# Patient Record
Sex: Male | Born: 1985 | Race: White | Hispanic: No | Marital: Married | Smoking: Never smoker
Health system: Southern US, Community
[De-identification: ages and names within clinical notes are randomized; demographics above are authoritative.]

## PROBLEM LIST (undated history)

## (undated) HISTORY — PX: WISDOM TOOTH EXTRACTION: SHX21

## (undated) HISTORY — PX: FOOT SURGERY: SHX648

---

## 2008-02-14 ENCOUNTER — Emergency Department (HOSPITAL_COMMUNITY): Admission: EM | Admit: 2008-02-14 | Discharge: 2008-02-14 | Payer: Self-pay | Admitting: Family Medicine

## 2009-01-04 ENCOUNTER — Ambulatory Visit: Payer: Self-pay | Admitting: Family Medicine

## 2009-01-04 DIAGNOSIS — R6883 Chills (without fever): Secondary | ICD-10-CM

## 2009-01-04 DIAGNOSIS — S8000XA Contusion of unspecified knee, initial encounter: Secondary | ICD-10-CM | POA: Insufficient documentation

## 2009-01-04 DIAGNOSIS — S82009A Unspecified fracture of unspecified patella, initial encounter for closed fracture: Secondary | ICD-10-CM

## 2009-01-04 LAB — CONVERTED CEMR LAB: Influenza B Ag: NEGATIVE

## 2010-10-01 ENCOUNTER — Ambulatory Visit: Payer: BC Managed Care – PPO | Attending: Podiatry | Admitting: Physical Therapy

## 2010-10-01 DIAGNOSIS — M6281 Muscle weakness (generalized): Secondary | ICD-10-CM | POA: Insufficient documentation

## 2010-10-01 DIAGNOSIS — M25676 Stiffness of unspecified foot, not elsewhere classified: Secondary | ICD-10-CM | POA: Insufficient documentation

## 2010-10-01 DIAGNOSIS — IMO0001 Reserved for inherently not codable concepts without codable children: Secondary | ICD-10-CM | POA: Insufficient documentation

## 2010-10-01 DIAGNOSIS — M25673 Stiffness of unspecified ankle, not elsewhere classified: Secondary | ICD-10-CM | POA: Insufficient documentation

## 2010-10-15 ENCOUNTER — Ambulatory Visit: Payer: BC Managed Care – PPO | Admitting: Physical Therapy

## 2011-04-14 ENCOUNTER — Emergency Department (INDEPENDENT_AMBULATORY_CARE_PROVIDER_SITE_OTHER)
Admission: EM | Admit: 2011-04-14 | Discharge: 2011-04-14 | Disposition: A | Discharge status: Home / Self Care | Payer: BC Managed Care – PPO | Source: Home / Self Care | Attending: Family Medicine | Admitting: Family Medicine

## 2011-04-14 DIAGNOSIS — J020 Streptococcal pharyngitis: Secondary | ICD-10-CM

## 2011-04-14 LAB — POCT RAPID STREP A (OFFICE): Rapid Strep A Screen: POSITIVE — AB

## 2011-04-14 MED ORDER — AMOXICILLIN 875 MG PO TABS: 875.0000 mg | ORAL_TABLET | Freq: Two times a day (BID) | ORAL | Status: AC

## 2011-04-14 NOTE — ED Provider Notes (Signed)
History     CSN: [id number]  Arrival date & time 04/14/11  1342   First MD Initiated Contact with Patient 04/14/11 1511      No chief complaint on file.     HPI Comments: Patient complains of two day history of a sore throat, followed by a mild cough but no nasal congestion.    Complains of fatigue but no myalgias.  His cough is non-productive.  There has been no pleuritic pain, shortness of breath, or wheezes.  He has had fever and sweats.  The history is provided by the patient.    History reviewed. No pertinent past medical history.  Past Surgical History  Procedure Date  . Foot surgery     cyst removed on right foot  . Wisdom tooth extraction     History reviewed. No pertinent family history.  History  Substance Use Topics  . Smoking status: Never Smoker   . Smokeless tobacco: Not on file  . Alcohol Use: Yes      Review of Systems + sore throat + mild cough No pleuritic pain No wheezing No nasal congestion ? post-nasal drainage No sinus pain/pressure No itchy/red eyes No earache No hemoptysis No SOB + fever, + chills No nausea No vomiting No abdominal pain No diarrhea No urinary symptoms No skin rashes + fatigue No myalgias + headache   Allergies  Review of patient's allergies indicates no known allergies.  Home Medications   Current Outpatient Rx  Name Route Sig Dispense Refill  . AMOXICILLIN 875 MG PO TABS Oral Take 1 tablet (875 mg total) by mouth 2 (two) times daily. 20 tablet 0    BP 111/78  Pulse 67  Temp(Src) 98.5 F (36.9 C) (Oral)  Resp 18  Ht 5\' 11"  (1.803 m)  Wt 152 lb (68.947 kg)  BMI 21.20 kg/m2  SpO2 100%  Physical Exam Nursing notes and Vital Signs reviewed. Appearance:  Patient appears healthy, stated age, and in no acute distress Eyes:  Pupils are equal, round, and reactive to light and accomodation.  Extraocular movement is intact.  Conjunctivae are not inflamed  Ears:  Canals normal.  Tympanic membranes normal.    Nose:  Mildly congested turbinates.  No sinus tenderness.   Pharynx:  Erythematous without exudate. Neck:  Supple.  Tender shotty anterior nodes are palpated bilaterally  Lungs:  Clear to auscultation.  Breath sounds are equal.  Heart:  Regular rate and rhythm without murmurs, rubs, or gallops.  Abdomen:  Nontender without masses or hepatosplenomegaly.  Bowel sounds are present.  No CVA or flank tenderness.  Extremities:  No edema.  No calf tenderness Skin:  No rash present.   ED Course  Procedures  none   Labs Reviewed  POCT RAPID STREP A (OFFICE) positive      1. Streptococcal sore throat       MDM  Begin amoxicillin. May take Ibuprofen 200mg , 4 tabs every 8 hours with food.  Try warm salt water gargles. Followup with Family Doctor if not improved in one week.         Lattie Haw, MD 04/15/11 1230

## 2011-04-14 NOTE — Discharge Instructions (Signed)
May take Ibuprofen 200mg , 4 tabs every 8 hours with food.  Try warm salt water gargles.  Strep Throat Strep throat is an infection of the throat caused by a bacteria named Streptococcus pyogenes. Your caregiver may call the infection streptococcal "tonsillitis" or "pharyngitis" depending on whether there are signs of inflammation in the tonsils or back of the throat. Strep throat is most common in children from 25 to 28 years old during the cold months of the year, but it can occur in people of any age during any season. This infection is spread from person to person (contagious) through coughing, sneezing, or other close contact. SYMPTOMS   Fever or chills.   Painful, swollen, red tonsils or throat.   Pain or difficulty when swallowing.   White or yellow spots on the tonsils or throat.   Swollen, tender lymph nodes or "glands" of the neck or under the jaw.   Red rash all over the body (rare).  DIAGNOSIS  Many different infections can cause the same symptoms. A test must be done to confirm the diagnosis so the right treatment can be given. A "rapid strep test" can help your caregiver make the diagnosis in a few minutes. If this test is not available, a light swab of the infected area can be used for a throat culture test. If a throat culture test is done, results are usually available in a day or two. TREATMENT  Strep throat is treated with antibiotic medicine. HOME CARE INSTRUCTIONS   Gargle with 1 tsp of salt in 1 cup of warm water, 3 to 4 times per day or as needed for comfort.   Family members who also have a sore throat or fever should be tested for strep throat and treated with antibiotics if they have the strep infection.   Make sure everyone in your household washes their hands well.   Do not share food, drinking cups, or personal items that could cause the infection to spread to others.   You may need to eat a soft food diet until your sore throat gets better.   Drink enough  water and fluids to keep your urine clear or pale yellow. This will help prevent dehydration.   Get plenty of rest.   Stay home from school, daycare, or work until you have been on antibiotics for 24 hours.   Only take over-the-counter or prescription medicines for pain, discomfort, or fever as directed by your caregiver.   If antibiotics are prescribed, take them as directed. Finish them even if you start to feel better.  SEEK MEDICAL CARE IF:   The glands in your neck continue to enlarge.   You develop a rash, cough, or earache.   You cough up green, yellow-brown, or bloody sputum.   You have pain or discomfort not controlled by medicines.   Your problems seem to be getting worse rather than better.  SEEK IMMEDIATE MEDICAL CARE IF:   You develop any new symptoms such as vomiting, severe headache, stiff or painful neck, chest pain, shortness of breath, or trouble swallowing.   You develop severe throat pain, drooling, or changes in your voice.   You develop swelling of the neck, or the skin on the neck becomes red and tender.   You have a fever.   You develop signs of dehydration, such as fatigue, dry mouth, and decreased urination.   You become increasingly sleepy, or you cannot wake up completely.  Document Released: 12/22/1999 Document Revised: 12/13/2010 Document Reviewed:  02/22/2010 Exit[employer] Patient Information 2012 [place], [place].

## 2011-04-14 NOTE — ED Notes (Signed)
Fever, sore throat and HA started friday

## 2012-03-18 ENCOUNTER — Encounter: Payer: Self-pay | Admitting: Emergency Medicine

## 2012-03-18 ENCOUNTER — Emergency Department
Admission: EM | Admit: 2012-03-18 | Discharge: 2012-03-18 | Disposition: A | Discharge status: Home / Self Care | Payer: BC Managed Care – PPO | Source: Home / Self Care | Attending: Family Medicine | Admitting: Family Medicine

## 2012-03-18 DIAGNOSIS — H6690 Otitis media, unspecified, unspecified ear: Secondary | ICD-10-CM

## 2012-03-18 DIAGNOSIS — M26629 Arthralgia of temporomandibular joint, unspecified side: Secondary | ICD-10-CM

## 2012-03-18 DIAGNOSIS — H6692 Otitis media, unspecified, left ear: Secondary | ICD-10-CM

## 2012-03-18 DIAGNOSIS — H9209 Otalgia, unspecified ear: Secondary | ICD-10-CM

## 2012-03-18 LAB — POCT URINALYSIS DIP (MANUAL ENTRY)

## 2012-03-18 MED ORDER — AMOXICILLIN 875 MG PO TABS: 875.0000 mg | ORAL_TABLET | Freq: Two times a day (BID) | ORAL | Status: DC

## 2012-03-18 NOTE — ED Provider Notes (Signed)
History     CSN: [id number]  Arrival date & time 03/18/12  1718   First MD Initiated Contact with Patient 03/18/12 1826      Chief Complaint  Patient presents with  . Otalgia       HPI Comments: Patient awoke with left earache and headache two days ago.  No sinus congestion.  No fevers, chills, and sweats.  He feels well otherwise. He admits that he grinds his teeth at night.                                The history is provided by the patient.    History reviewed. No pertinent past medical history.  Past Surgical History  Procedure Laterality Date  . Foot surgery      cyst removed on right foot  . Wisdom tooth extraction      History reviewed. No pertinent family history.  History  Substance Use Topics  . Smoking status: Never Smoker   . Smokeless tobacco: Not on file  . Alcohol Use: Yes      Review of Systems No sore throat No cough No pleuritic pain No wheezing No nasal congestion No post-nasal drainage No sinus pain/pressure No itchy/red eyes + left earache No hemoptysis No SOB No fever/chills No nausea No vomiting No abdominal pain No diarrhea No urinary symptoms No skin rashes No fatigue No myalgias + headache    Allergies  Review of patient's allergies indicates no known allergies.  Home Medications   Current Outpatient Rx  Name  Route  Sig  Dispense  Refill  . amoxicillin (AMOXIL) 875 MG tablet   Oral   Take 1 tablet (875 mg total) by mouth 2 (two) times daily.   20 tablet   0     BP 99/65  Temp(Src) 98.1 F (36.7 C) (Oral)  Ht 5\' 11"  (1.803 m)  Wt 150 lb (68.04 kg)  BMI 20.93 kg/m2  SpO2 97%  Physical Exam Nursing notes and Vital Signs reviewed. Appearance:  Patient appears healthy, stated age, and in no acute distress Eyes:  Pupils are equal, round, and reactive to light and accomodation.  Extraocular movement is intact.  Conjunctivae are not inflamed  Ears:  Canals normal.  Tympanic membranes normal.  There is  distinct tenderness over the left temporomandibular joint.  Palpation there recreates his pain.   Nose:  Mildly congested turbinates.  No sinus tenderness.  Pharynx:  Normal Neck:  Supple.  No adenopathy Lungs:  Clear to auscultation.  Breath sounds are equal.  Heart:  Regular rate and rhythm without murmurs, rubs, or gallops.  Abdomen:  Nontender  Extremities:  Normal Skin:  No rash present.   ED Course  Procedures  none   Tympanogram:  Left ear low peak height ("flat line"); Right ear positive peak pressure   1. Left otitis media   2. TMJ arthralgia       MDM  Begin amoxicillin. Take Mucinex D (guaifenesin with decongestant) twice daily for congestion.  Increase fluid intake, rest. May use Afrin nasal spray (or generic oxymetazoline) twice daily for about 5 days.  Also recommend using saline nasal spray several times daily and saline nasal irrigation (AYR is a common brand) Stop all antihistamines for now, and other non-prescription cough/cold preparations. May take Ibuprofen 200mg , 4 tabs every 8 hours with food for left TMJ pain.  Apply ice pack to left TMJ several times daily for  about 15 minutes Given a [employer] patient information and instruction sheet on topic TMJ pain. Followup with ENT if not improving about 10 days.         Lattie Haw, MD 03/23/12 1426

## 2012-03-18 NOTE — ED Notes (Signed)
Reports 3 days history of left ear pain. No OTCs today.

## 2013-07-04 ENCOUNTER — Emergency Department
Admission: EM | Admit: 2013-07-04 | Discharge: 2013-07-04 | Disposition: A | Discharge status: Home / Self Care | Payer: BC Managed Care – PPO | Source: Home / Self Care | Attending: Family Medicine | Admitting: Family Medicine

## 2013-07-04 ENCOUNTER — Encounter: Payer: Self-pay | Admitting: Emergency Medicine

## 2013-07-04 DIAGNOSIS — S51809A Unspecified open wound of unspecified forearm, initial encounter: Secondary | ICD-10-CM | POA: Diagnosis not present

## 2013-07-04 DIAGNOSIS — W540XXA Bitten by dog, initial encounter: Secondary | ICD-10-CM | POA: Diagnosis not present

## 2013-07-04 DIAGNOSIS — S41109A Unspecified open wound of unspecified upper arm, initial encounter: Secondary | ICD-10-CM

## 2013-07-04 DIAGNOSIS — S41151A Open bite of right upper arm, initial encounter: Secondary | ICD-10-CM

## 2013-07-04 DIAGNOSIS — Z23 Encounter for immunization: Secondary | ICD-10-CM

## 2013-07-04 DIAGNOSIS — S51811A Laceration without foreign body of right forearm, initial encounter: Secondary | ICD-10-CM

## 2013-07-04 MED ORDER — AMOXICILLIN-POT CLAVULANATE 875-125 MG PO TABS: 1.0000 | ORAL_TABLET | Freq: Two times a day (BID) | ORAL | Status: DC

## 2013-07-04 MED ORDER — TETANUS-DIPHTH-ACELL PERTUSSIS 5-2.5-18.5 LF-MCG/0.5 IM SUSP
0.5000 mL | Freq: Once | INTRAMUSCULAR | Status: AC
  Administered 2013-07-04: 0.5 mL via INTRAMUSCULAR

## 2013-07-04 NOTE — Discharge Instructions (Signed)
Change dressing daily and apply Bacitracin ointment to wound.  Keep wound clean and dry.  Return for any signs of infection (or follow-up with family doctor):  Increasing redness, swelling, pain, heat, drainage, etc. °Return in 10 days for suture removal.   ° ° °Laceration Care, Adult °A laceration is a cut or lesion that goes through all layers of the skin and into the tissue just beneath the skin. °TREATMENT  °Some lacerations may not require closure. Some lacerations may not be able to be closed due to an increased risk of infection. It is important to see your caregiver as soon as possible after an injury to minimize the risk of infection and maximize the opportunity for successful closure. °If closure is appropriate, pain medicines may be given, if needed. The wound will be cleaned to help prevent infection. Your caregiver will use stitches (sutures), staples, wound glue (adhesive), or skin adhesive strips to repair the laceration. These tools bring the skin edges together to allow for faster healing and a better cosmetic outcome. However, all wounds will heal with a scar. Once the wound has healed, scarring can be minimized by covering the wound with sunscreen during the day for 1 full year. °HOME CARE INSTRUCTIONS  °For sutures or staples: °· Keep the wound clean and dry. °· If you were given a bandage (dressing), you should change it at least once a day. Also, change the dressing if it becomes wet or dirty, or as directed by your caregiver. °· Wash the wound with soap and water 2 times a day. Rinse the wound off with water to remove all soap. Pat the wound dry with a clean towel. °· After cleaning, apply a thin layer of the antibiotic ointment as recommended by your caregiver. This will help prevent infection and keep the dressing from sticking. °· You may shower as usual after the first 24 hours. Do not soak the wound in water until the sutures are removed. °· Only take over-the-counter or prescription  medicines for pain, discomfort, or fever as directed by your caregiver. °· Get your sutures or staples removed as directed by your caregiver. °For skin adhesive strips: °· Keep the wound clean and dry. °· Do not get the skin adhesive strips wet. You may bathe carefully, using caution to keep the wound dry. °· If the wound gets wet, pat it dry with a clean towel. °· Skin adhesive strips will fall off on their own. You may trim the strips as the wound heals. Do not remove skin adhesive strips that are still stuck to the wound. They will fall off in time. °For wound adhesive: °· You may briefly wet your wound in the shower or bath. Do not soak or scrub the wound. Do not swim. Avoid periods of heavy perspiration until the skin adhesive has fallen off on its own. After showering or bathing, gently pat the wound dry with a clean towel. °· Do not apply liquid medicine, cream medicine, or ointment medicine to your wound while the skin adhesive is in place. This may loosen the film before your wound is healed. °· If a dressing is placed over the wound, be careful not to apply tape directly over the skin adhesive. This may cause the adhesive to be pulled off before the wound is healed. °· Avoid prolonged exposure to sunlight or tanning lamps while the skin adhesive is in place. Exposure to ultraviolet light in the first year will darken the scar. °· The skin adhesive will usually   remain in place for 5 to 10 days, then naturally fall off the skin. Do not pick at the adhesive film. You may need a tetanus shot if:  You cannot remember when you had your last tetanus shot.  You have never had a tetanus shot. If you get a tetanus shot, your arm may swell, get red, and feel warm to the touch. This is common and not a problem. If you need a tetanus shot and you choose not to have one, there is a rare chance of getting tetanus. Sickness from tetanus can be serious. SEEK MEDICAL CARE IF:   You have redness, swelling, or  increasing pain in the wound.  You see a red line that goes away from the wound.  You have yellowish-white fluid (pus) coming from the wound.  You have a fever.  You notice a bad smell coming from the wound or dressing.  Your wound breaks open before or after sutures have been removed.  You notice something coming out of the wound such as wood or glass.  Your wound is on your hand or foot and you cannot move a finger or toe. SEEK IMMEDIATE MEDICAL CARE IF:   Your pain is not controlled with prescribed medicine.  You have severe swelling around the wound causing pain and numbness or a change in color in your arm, hand, leg, or foot.  Your wound splits open and starts bleeding.  You have worsening numbness, weakness, or loss of function of any joint around or beyond the wound.  You develop painful lumps near the wound or on the skin anywhere on your body. MAKE SURE YOU:   Understand these instructions.  Will watch your condition.  Will get help right away if you are not doing well or get worse. Document Released: 12/24/2004 Document Revised: 03/18/2011 Document Reviewed: 06/19/2010 [facility] Patient Information 2015 [place], [place]. This information is not intended to replace advice given to you by your health care provider. Make sure you discuss any questions you have with your health care provider.    Animal Bite An animal bite can result in a scratch on the skin, deep open cut, puncture of the skin, crush injury, or tearing away of the skin or a body part. Dogs are responsible for most animal bites. Children are bitten more often than adults. An animal bite can range from very mild to more serious. A small bite from your house pet is no cause for alarm. However, some animal bites can become infected or injure a bone or other tissue. You must seek medical care if:  The skin is broken and bleeding does not slow down or stop after 15 minutes.  The puncture is deep and  difficult to clean (such as a cat bite).  Pain, warmth, redness, or pus develops around the wound.  The bite is from a stray animal or rodent. There may be a risk of rabies infection.  The bite is from a snake, raccoon, skunk, fox, coyote, or bat. There may be a risk of rabies infection.  The person bitten has a chronic illness such as diabetes, liver disease, or cancer, or the person takes medicine that lowers the immune system.  There is concern about the location and severity of the bite. It is important to clean and protect an animal bite wound right away to prevent infection. Follow these steps:  Clean the wound with plenty of water and soap.  Apply an antibiotic cream.  Apply gentle pressure over the  wound with a clean towel or gauze to slow or stop bleeding.  Elevate the affected area above the heart to help stop any bleeding.  Seek medical care. Getting medical care within 8 hours of the animal bite leads to the best possible outcome. DIAGNOSIS  Your caregiver will most likely:  Take a detailed history of the animal and the bite injury.  Perform a wound exam.  Take your medical history. Blood tests or X-rays may be performed. Sometimes, infected bite wounds are cultured and sent to a lab to identify the infectious bacteria.  TREATMENT  Medical treatment will depend on the location and type of animal bite as well as the patient's medical history. Treatment may include:  Wound care, such as cleaning and flushing the wound with saline solution, bandaging, and elevating the affected area.  Antibiotics.  Tetanus immunization.  Rabies immunization.  Leaving the wound open to heal. This is often done with animal bites, due to the high risk of infection. However, in certain cases, wound closure with stitches, wound adhesive, skin adhesive strips, or staples may be used. Infected bites that are left untreated may require intravenous (IV) antibiotics and surgical treatment  in the hospital. HOME CARE INSTRUCTIONS  Follow your caregiver's instructions for wound care.  Take all medicines as directed.  If your caregiver prescribes antibiotics, take them as directed. Finish them even if you start to feel better.  Follow up with your caregiver for further exams or immunizations as directed. You may need a tetanus shot if:  You cannot remember when you had your last tetanus shot.  You have never had a tetanus shot.  The injury broke your skin. If you get a tetanus shot, your arm may swell, get red, and feel warm to the touch. This is common and not a problem. If you need a tetanus shot and you choose not to have one, there is a rare chance of getting tetanus. Sickness from tetanus can be serious. SEEK MEDICAL CARE IF:  You notice warmth, redness, soreness, swelling, pus discharge, or a bad smell coming from the wound.  You have a red line on the skin coming from the wound.  You have a fever, chills, or a general ill feeling.  You have nausea or vomiting.  You have continued or worsening pain.  You have trouble moving the injured part.  You have other questions or concerns. MAKE SURE YOU:  Understand these instructions.  Will watch your condition.  Will get help right away if you are not doing well or get worse. Document Released: 09/11/2010 Document Revised: 03/18/2011 Document Reviewed: 09/11/2010 [facility] Patient Information 2015 [place], [place]. This information is not intended to replace advice given to you by your health care provider. Make sure you discuss any questions you have with your health care provider.

## 2013-07-04 NOTE — ED Provider Notes (Signed)
CSN: [id number]     Arrival date & time 07/04/13  1108 History   First MD Initiated Contact with Patient 07/04/13 1120     Chief Complaint  Patient presents with  . Animal Bite      HPI Comments: Patient's dog began fight with a visiting friend's dog today.  Patient tried to separate the two dogs and his own dog bit him on his right arm.  His dog's rabies vaccination is current.  Patient believes that his last Tdap was in 2005.  Patient is a 28 y.o. male presenting with animal bite. The history is provided by the patient.  Animal Bite Contact animal:  Dog Location:  Shoulder/arm Shoulder/arm injury location:  R forearm Time since incident:  2 hours Incident location:  Home Animal's rabies vaccination status:  Up to date Animal in possession: yes   Associated symptoms: no numbness and no swelling     History reviewed. No pertinent past medical history. Past Surgical History  Procedure Laterality Date  . Foot surgery      cyst removed on right foot  . Wisdom tooth extraction     History reviewed. No pertinent family history. History  Substance Use Topics  . Smoking status: Never Smoker   . Smokeless tobacco: Not on file  . Alcohol Use: Yes     Comment: 1    Review of Systems  Neurological: Negative for numbness.    Allergies  Review of patient's allergies indicates no known allergies.  Home Medications   Prior to Admission medications   Medication Sig Start Date End Date Taking? Authorizing Provider  amoxicillin-clavulanate (AUGMENTIN) 875-125 MG per tablet Take 1 tablet by mouth 2 (two) times daily. Take with food 07/04/13   Lattie HawStephen A Beese, MD   BP 112/68  Pulse 61  Temp(Src) 98.2 F (36.8 C) (Oral)  Resp 20  Ht 5\' 11"  (1.803 m)  Wt 154 lb 4 oz (69.967 kg)  BMI 21.52 kg/m2  SpO2 100% Physical Exam  Nursing note and vitals reviewed. Constitutional: He is oriented to person, place, and time. He appears well-developed and well-nourished. No distress.  HENT:    Head: Atraumatic.  Eyes: Conjunctivae are normal. Pupils are equal, round, and reactive to light.  Neurological: He is alert and oriented to person, place, and time.  Skin: Skin is warm and dry. Laceration noted.     Laceration #2:  6mm by 2mm    ED Course  Procedures Laceration Repair Discussed benefits and risks of procedure and verbal consent obtained. Using sterile technique and digital 1% lidocaine with epinephrine, cleansed wounds with Betadine followed by copious lavage with normal saline.  Wounds carefully inspected for debris and foreign bodies; none found.  Each wound loosely and partially closed with a single 4-0 nylon suture.  Bacitracin and non-stick sterile dressing applied.  Wound precautions explained to patient.  Return for suture removal in 10 days.        MDM   1. Dog bite of arm, right, initial encounter   2. Laceration of right forearm, initial encounter.  Each wound partially closed with a single suture to promote more rapid epithialization.    Tdap administered Begin Augmentin. Change dressing daily and apply Bacitracin ointment to wound.  Keep wound clean and dry.  Return for any signs of infection (or follow-up with family doctor):  Increasing redness, swelling, pain, heat, drainage, etc. Return in 10 days for suture removal.       Lattie HawStephen A Beese, MD 07/04/13  1810 

## 2013-07-04 NOTE — ED Notes (Addendum)
Sustained dog bite from own dog while trying to separated from fight. Sustained 2 lacerations, one on dorsal surface and one on prone surface plus 3 minor scratches from other teeth. [facility]

## 2013-07-06 ENCOUNTER — Encounter: Payer: Self-pay | Admitting: Emergency Medicine

## 2013-07-06 ENCOUNTER — Emergency Department
Admission: EM | Admit: 2013-07-06 | Discharge: 2013-07-06 | Disposition: A | Discharge status: Home / Self Care | Payer: BC Managed Care – PPO | Source: Home / Self Care | Attending: Emergency Medicine | Admitting: Emergency Medicine

## 2013-07-06 DIAGNOSIS — S51809A Unspecified open wound of unspecified forearm, initial encounter: Secondary | ICD-10-CM

## 2013-07-06 DIAGNOSIS — S51851S Open bite of right forearm, sequela: Secondary | ICD-10-CM

## 2013-07-06 DIAGNOSIS — L089 Local infection of the skin and subcutaneous tissue, unspecified: Secondary | ICD-10-CM

## 2013-07-06 DIAGNOSIS — W540XXS Bitten by dog, sequela: Secondary | ICD-10-CM

## 2013-07-06 DIAGNOSIS — W540XXD Bitten by dog, subsequent encounter: Principal | ICD-10-CM

## 2013-07-06 DIAGNOSIS — S51851D Open bite of right forearm, subsequent encounter: Secondary | ICD-10-CM

## 2013-07-06 MED ORDER — DOXYCYCLINE HYCLATE 100 MG PO CAPS: 100.0000 mg | ORAL_CAPSULE | Freq: Two times a day (BID) | ORAL | Status: DC

## 2013-07-06 MED ORDER — CEFTRIAXONE SODIUM 1 G IJ SOLR
1.0000 g | INTRAMUSCULAR | Status: AC
  Administered 2013-07-06: 1 g via INTRAMUSCULAR

## 2013-07-06 MED ORDER — CLINDAMYCIN HCL 150 MG PO CAPS: 150.0000 mg | ORAL_CAPSULE | Freq: Four times a day (QID) | ORAL | Status: DC

## 2013-07-06 NOTE — ED Notes (Signed)
The pt is here today for a recheck of his dog bite to his LT forearm x 2 days ago. He reports increased redness and swelling around the bite.

## 2013-07-06 NOTE — ED Provider Notes (Signed)
CSN: [id number]     Arrival date & time 07/06/13  1537 History   First MD Initiated Contact with Patient 07/06/13 1550     Chief Complaint  Patient presents with  . Animal Bite   (Consider location/radiation/quality/duration/timing/severity/associated sxs/prior Treatment) HPI Was seen here 2 days ago for a dog bite right forearm. I've reviewed the notes here from Dr. Cathren HarshBeese from 07/04/2013.  Patient reports mildly increased redness and swelling around the bite right forearm. No bleeding or drainage or red streaks. Mild discomfort, pain 2/10. No fever or chills or systemic symptoms. He otherwise feels well. History reviewed. No pertinent past medical history. Past Surgical History  Procedure Laterality Date  . Foot surgery      cyst removed on right foot  . Wisdom tooth extraction     History reviewed. No pertinent family history. History  Substance Use Topics  . Smoking status: Never Smoker   . Smokeless tobacco: Not on file  . Alcohol Use: Yes     Comment: 1    Review of Systems  All other systems reviewed and are negative.   Allergies  Review of patient's allergies indicates no known allergies.  Home Medications   Prior to Admission medications   Medication Sig Start Date End Date Taking? Authorizing Provider  clindamycin (CLEOCIN) 150 MG capsule Take 1 capsule (150 mg total) by mouth every 6 (six) hours. X10 days 07/06/13   Lajean Manesavid Massey, MD  doxycycline (VIBRAMYCIN) 100 MG capsule Take 1 capsule (100 mg total) by mouth 2 (two) times daily. 07/06/13   Lajean Manesavid Massey, MD   BP 126/78  Pulse 53  Temp(Src) 98.3 F (36.8 C) (Oral)  Resp 16  SpO2 100% Physical Exam  Musculoskeletal:       Arms: As depicted, 2 x 4 cm area of redness, warmth, minimal induration, right forearm. No drainage or bleeding or fluctuance or red streaks.  Neurovascular distally intact. No bony tenderness. No joint swelling. Function right upper extremity normal.   No distress . ED Course    Procedures (including critical care time) Labs Review Labs Reviewed - No data to display  Imaging Review No results found.   MDM   1. Dog bite of right forearm, subsequent encounter   2. Dog bite of right forearm with infection, sequela    Mild cellulitis. No evidence of abscess or ascending lymphangitis. Treatment options discussed, as well as risks, benefits, alternatives. Patient voiced understanding and agreement with the following plans: Rocephin 1 g IM stat  DC Augmentin. Wound cleansed and redressed. Begin doxycycline and clindamycin. I've elected not to remove the one stitch from each puncture wound, as the wounds are partially open, and there is room for any drainage if needed. However there is not currently any drainage or bleeding or fluctuance.  Red flags discussed. Return in 2 days for recheck, sooner if worse. If any worsening, will remove for each stitch, but he declined that option today.  Lajean Manesavid Massey, MD 07/06/13 [phone]

## 2013-07-08 ENCOUNTER — Emergency Department (INDEPENDENT_AMBULATORY_CARE_PROVIDER_SITE_OTHER)
Admission: EM | Admit: 2013-07-08 | Discharge: 2013-07-08 | Disposition: A | Discharge status: Home / Self Care | Payer: BC Managed Care – PPO | Source: Home / Self Care | Attending: Emergency Medicine | Admitting: Emergency Medicine

## 2013-07-08 ENCOUNTER — Encounter: Payer: Self-pay | Admitting: Emergency Medicine

## 2013-07-08 DIAGNOSIS — S51809A Unspecified open wound of unspecified forearm, initial encounter: Secondary | ICD-10-CM

## 2013-07-08 DIAGNOSIS — S51851D Open bite of right forearm, subsequent encounter: Secondary | ICD-10-CM

## 2013-07-08 DIAGNOSIS — W540XXD Bitten by dog, subsequent encounter: Principal | ICD-10-CM

## 2013-07-08 NOTE — ED Provider Notes (Addendum)
CSN: [id number]     Arrival date & time 07/08/13  1340 History   First MD Initiated Contact with Patient 07/08/13 1344     Chief Complaint  Patient presents with  . Animal Bite   (Consider location/radiation/quality/duration/timing/severity/associated sxs/prior Treatment) HPI Eric Esparza is here to follow up dog bite. Denies fever, chills or sweats. He has had a decrease in redness and swelling around the bite right forearm after being switched to doxycycline and clindamycin at recheck 2 days ago.   History reviewed. No pertinent past medical history. Past Surgical History  Procedure Laterality Date  . Foot surgery      cyst removed on right foot  . Wisdom tooth extraction     History reviewed. No pertinent family history. History  Substance Use Topics  . Smoking status: Never Smoker   . Smokeless tobacco: Not on file  . Alcohol Use: Yes     Comment: 1    Review of Systems  All other systems reviewed and are negative.   Allergies  Review of patient's allergies indicates no known allergies.  Home Medications   Prior to Admission medications   Medication Sig Start Date End Date Taking? Authorizing Provider  clindamycin (CLEOCIN) 150 MG capsule Take 1 capsule (150 mg total) by mouth every 6 (six) hours. X10 days 07/06/13  Yes Lajean Manesavid Massey, MD  doxycycline (VIBRAMYCIN) 100 MG capsule Take 1 capsule (100 mg total) by mouth 2 (two) times daily. 07/06/13  Yes Lajean Manesavid Massey, MD   BP 112/72  Pulse 53  Temp(Src) 98.2 F (36.8 C) (Oral)  Ht 5\' 11"  (1.803 m)  Wt 155 lb (70.308 kg)  BMI 21.63 kg/m2  SpO2 98% Physical Exam  Nursing note and vitals reviewed. Constitutional: He is oriented to person, place, and time. He appears well-developed and well-nourished. No distress.  HENT:  Head: Normocephalic and atraumatic.  Eyes: Conjunctivae and EOM are normal. Pupils are equal, round, and reactive to light. No scleral icterus.  Neck: Normal range of motion.  Cardiovascular: Normal rate.    Pulmonary/Chest: Effort normal.  Abdominal: He exhibits no distension.  Musculoskeletal: Normal range of motion.  Neurological: He is alert and oriented to person, place, and time.  Skin: Skin is warm.  Psychiatric: He has a normal mood and affect.   Right forearm: The swelling, redness, warmth has resolved from 2 days ago. The 2 small wounds are clean and dry with 1 suture intact in each. Healing and granulating in as expected , 4 days status post wound/dog bite. Minimal tenderness . Neurovascular distally intact ED Course  Procedures (including critical care time) Labs Review Labs Reviewed - No data to display  Imaging Review No results found.   MDM   1. Dog bite of forearm, right, subsequent encounter    4 days status post dog bite  Significantly improving. Cellulitis is much improving. Today, we cleansed the 2 wounds with Hibiclens and dressed with Polysporin and nonstick dressing . Continue doxycycline and clindamycin. Continued daily wound care at home Red flags discussed. Return in 6 days for recheck and would anticipate suture removal if he is doing well.   Lajean Manesavid Massey, MD 07/08/13 1546  Lajean Manesavid Massey, MD 07/08/13 [phone]

## 2013-07-08 NOTE — ED Notes (Signed)
Eric RuizJohn is here to follow up dog bite. Denies fever, chills or sweats. He has had a decrease in redness and swelling around the bite after being on antibiotic.

## 2013-07-14 ENCOUNTER — Encounter: Payer: Self-pay | Admitting: Emergency Medicine

## 2013-07-14 ENCOUNTER — Emergency Department (INDEPENDENT_AMBULATORY_CARE_PROVIDER_SITE_OTHER)
Admission: EM | Admit: 2013-07-14 | Discharge: 2013-07-14 | Disposition: A | Discharge status: Home / Self Care | Payer: BC Managed Care – PPO | Source: Home / Self Care | Attending: Emergency Medicine | Admitting: Emergency Medicine

## 2013-07-14 DIAGNOSIS — Z4802 Encounter for removal of sutures: Secondary | ICD-10-CM

## 2013-07-14 NOTE — ED Provider Notes (Signed)
CSN: [id number]     Arrival date & time 07/14/13  1428 History   First MD Initiated Contact with Patient 07/14/13 1437     Chief Complaint  Patient presents with  . Suture / Staple Removal   (Consider location/radiation/quality/duration/timing/severity/associated sxs/prior Treatment) HPI Patient here for followup and suture removal.  He was 10 days ago that he had a dog bite and had 2 sutures placed.  And 2 days later he had a small infection and was then switched from amoxicillin to clindamycin..  No further signs of infection, no fever, chills, nausea, vomiting or any other constitutional symptoms.  No redness.  No past medical history on file. Past Surgical History  Procedure Laterality Date  . Foot surgery      cyst removed on right foot  . Wisdom tooth extraction     No family history on file. History  Substance Use Topics  . Smoking status: Never Smoker   . Smokeless tobacco: Not on file  . Alcohol Use: Yes     Comment: 1    Review of Systems  All other systems reviewed and are negative.   Allergies  Review of patient's allergies indicates no known allergies.  Home Medications   Prior to Admission medications   Medication Sig Start Date End Date Taking? Authorizing Provider  clindamycin (CLEOCIN) 150 MG capsule Take 1 capsule (150 mg total) by mouth every 6 (six) hours. X10 days 07/06/13   Lajean Manesavid Massey, MD  doxycycline (VIBRAMYCIN) 100 MG capsule Take 1 capsule (100 mg total) by mouth 2 (two) times daily. 07/06/13   Lajean Manesavid Massey, MD   There were no vitals taken for this visit. Physical Exam  Nursing note and vitals reviewed. Constitutional: He is oriented to person, place, and time. He appears well-developed and well-nourished.  HENT:  Head: Normocephalic and atraumatic.  Eyes: No scleral icterus.  Neck: Neck supple.  Cardiovascular: Regular rhythm and normal heart sounds.   Pulmonary/Chest: Effort normal and breath sounds normal. No respiratory distress.   Neurological: He is alert and oriented to person, place, and time.  Skin: Skin is warm and dry.  See previous note for locations.  Right arm wounds show no signs of infection, no tenderness, no redness, no wound dehiscence, no drainage, no bleeding, no induration or fluctuance.  Psychiatric: He has a normal mood and affect. His speech is normal.    ED Course  Procedures (including critical care time) Labs Review Labs Reviewed - No data to display  Imaging Review No results found.   MDM  No diagnosis found.  Sutures are removed.  No wound dehiscence.  No signs of infection.  Patient is going to stop the antibiotics as of today.  Covered with a small Steri-Strip, wound precautions are given.  Marlaine HindJeffrey H Kamariah Fruchter, MD 07/14/13 [phone]

## 2013-07-14 NOTE — ED Notes (Signed)
Eric RuizJohn is here today for suture removal from his RT forearm.

## 2014-01-12 ENCOUNTER — Emergency Department
Admission: EM | Admit: 2014-01-12 | Discharge: 2014-01-12 | Disposition: A | Discharge status: Home / Self Care | Payer: BLUE CROSS/BLUE SHIELD | Source: Home / Self Care | Attending: Family Medicine | Admitting: Family Medicine

## 2014-01-12 ENCOUNTER — Encounter: Payer: Self-pay | Admitting: *Deleted

## 2014-01-12 DIAGNOSIS — M545 Low back pain, unspecified: Secondary | ICD-10-CM

## 2014-01-12 MED ORDER — MELOXICAM 15 MG PO TABS: 15.0000 mg | ORAL_TABLET | Freq: Every day | ORAL | Status: DC

## 2014-01-12 MED ORDER — CYCLOBENZAPRINE HCL 10 MG PO TABS: ORAL_TABLET | ORAL | Status: DC

## 2014-01-12 NOTE — Discharge Instructions (Signed)
Apply ice pack for 20 to 30 minutes, 3 to 4 times daily  Continue until pain decreases.  ° ° °Back Pain, Adult °Low back pain is very common. About 1 in 5 people have back pain. The cause of low back pain is rarely dangerous. The pain often gets better over time. About half of people with a sudden onset of back pain feel better in just 2 weeks. About 8 in 10 people feel better by 6 weeks.  °CAUSES °Some common causes of back pain include: °· Strain of the muscles or ligaments supporting the spine. °· Wear and tear (degeneration) of the spinal discs. °· Arthritis. °· Direct injury to the back. °DIAGNOSIS °Most of the time, the direct cause of low back pain is not known. However, back pain can be treated effectively even when the exact cause of the pain is unknown. Answering your caregiver's questions about your overall health and symptoms is one of the most accurate ways to make sure the cause of your pain is not dangerous. If your caregiver needs more information, he or she may order lab work or imaging tests (X-rays or MRIs). However, even if imaging tests show changes in your back, this usually does not require surgery. °HOME CARE INSTRUCTIONS °For many people, back pain returns. Since low back pain is rarely dangerous, it is often a condition that people can learn to manage on their own.  °· Remain active. It is stressful on the back to sit or stand in one place. Do not sit, drive, or stand in one place for more than 30 minutes at a time. Take short walks on level surfaces as soon as pain allows. Try to increase the length of time you walk each day. °· Do not stay in bed. Resting more than 1 or 2 days can delay your recovery. °· Do not avoid exercise or work. Your body is made to move. It is not dangerous to be active, even though your back may hurt. Your back will likely heal faster if you return to being active before your pain is gone. °· Pay attention to your body when you  bend and lift. Many people have  less discomfort when lifting if they bend their knees, keep the load close to their bodies, and avoid twisting. Often, the most comfortable positions are those that put less stress on your recovering back. °· Find a comfortable position to sleep. Use a firm mattress and lie on your side with your knees slightly bent. If you lie on your back, put a pillow under your knees. °· Only take over-the-counter or prescription medicines as directed by your caregiver. Over-the-counter medicines to reduce pain and inflammation are often the most helpful. Your caregiver may prescribe muscle relaxant drugs. These medicines help dull your pain so you can more quickly return to your normal activities and healthy exercise. °· Put ice on the injured area. °¨ Put ice in a plastic bag. °¨ Place a towel between your skin and the bag. °¨ Leave the ice on for 15-20 minutes, 03-04 times a day for the first 2 to 3 days. After that, ice and heat may be alternated to reduce pain and spasms. °· Ask your caregiver about trying back exercises and gentle massage. This may be of some benefit. °· Avoid feeling anxious or stressed. Stress increases muscle tension and can worsen back pain. It is important to recognize when you are anxious or stressed and learn ways to manage it. Exercise is a great option. °SEEK MEDICAL CARE IF: °· You have pain that is not relieved with rest or medicine. °· You have   pain that does not improve in 1 week. °· You have new symptoms. °· You are generally not feeling well. °SEEK IMMEDIATE MEDICAL CARE IF:  °· You have pain that radiates from your back into your legs. °· You develop new bowel or bladder control problems. °· You have unusual weakness or numbness in your arms or legs. °· You develop nausea or vomiting. °· You develop abdominal pain. °· You feel faint. °Document Released: 12/24/2004 Document Revised: 06/25/2011 Document Reviewed: 04/27/2013 °Exit[employer]® Patient Information ©2015 [employer]. This information  is not intended to replace advice given to you by your health care provider. Make sure you discuss any questions you have with your health care provider. ° °

## 2014-01-12 NOTE — ED Notes (Signed)
Pt c/o low back pain x 5 days. Denies injury.  No OTC meds.

## 2014-01-12 NOTE — ED Provider Notes (Signed)
CSN: [id number]     Arrival date & time 01/12/14  1405 History   First MD Initiated Contact with Patient 01/12/14 1458     Chief Complaint  Patient presents with  . Back Pain      HPI Comments: Patient complains of five day history of constant sharp non-radiating lower back pain.  The pain is worse when walking and with movement, such as climbing into a car.  No bowel or bladder dysfunction, and no saddle numbness.  He recalls no injury.  However, he had resection of a ganglion cyst on his left foot 3 weeks ago and wore a cast boot for two weeks, resulting in an abnormal gait.  Patient is a 29 y.o. male presenting with back pain. The history is provided by the patient.  Back Pain Location:  Lumbar spine Quality:  Stabbing Radiates to:  Does not radiate Pain severity:  Moderate Pain is:  Same all the time Onset quality:  Sudden Duration:  5 days Timing:  Constant Progression:  Unchanged Chronicity:  New Context comment:  Recent foot surgery Relieved by:  Nothing Worsened by:  Movement and ambulation Ineffective treatments:  Ibuprofen Associated symptoms: no abdominal pain, no bladder incontinence, no bowel incontinence, no dysuria, no fever, no leg pain, no numbness, no paresthesias, no perianal numbness, no tingling, no weakness and no weight loss     History reviewed. No pertinent past medical history. Past Surgical History  Procedure Laterality Date  . Foot surgery      cyst removed on right foot  . Wisdom tooth extraction     History reviewed. No pertinent family history. History  Substance Use Topics  . Smoking status: Never Smoker   . Smokeless tobacco: Not on file  . Alcohol Use: Yes     Comment: 1    Review of Systems  Constitutional: Negative for fever and weight loss.  Gastrointestinal: Negative for abdominal pain and bowel incontinence.  Genitourinary: Negative for bladder incontinence and dysuria.  Musculoskeletal: Positive for back pain.  Neurological:  Negative for tingling, weakness, numbness and paresthesias.  All other systems reviewed and are negative.   Allergies  Review of patient's allergies indicates no known allergies.  Home Medications   Prior to Admission medications   Medication Sig Start Date End Date Taking? Authorizing Provider  cyclobenzaprine (FLEXERIL) 10 MG tablet Take one tab by mouth at bedtime 01/12/14   Lattie Haw, MD  meloxicam (MOBIC) 15 MG tablet Take 1 tablet (15 mg total) by mouth daily. Take with food each morning 01/12/14   Lattie Haw, MD   BP 115/74 mmHg  Pulse 50  Temp(Src) 98.1 F (36.7 C) (Oral)  Resp 14  Ht  (1.803 m)  Wt 156 lb (70.761 kg)  BMI 21.77 kg/m2  SpO2 98% Physical Exam  Constitutional: He is oriented to person, place, and time. He appears well-developed and well-nourished. No distress.  HENT:  Head: Normocephalic.  Eyes: Pupils are equal, round, and reactive to light.  Cardiovascular: Normal heart sounds.   Pulmonary/Chest: Breath sounds normal.  Abdominal: There is no tenderness.  Musculoskeletal:       Lumbar back: He exhibits normal range of motion, no tenderness, no bony tenderness, no swelling, no edema and no deformity.  Back:  Can heel/toe walk and squat without difficulty.  No tenderness to palpation.  Straight leg raising test is negative.  Sitting knee extension test is negative.  Strength and sensation in the lower extremities is normal.  Patellar and achilles reflexes are normal   Neurological: He is alert and oriented to person, place, and time.  Skin: Skin is warm and dry.  Nursing note and vitals reviewed.   ED Course  Procedures  none     MDM   1. Bilateral low back pain without sciatica    Begin Mobic and Flexeril. Apply ice pack for 20 to 30 minutes, 3 to 4 times daily  Continue until pain decreases. Begin back exercises. Followup with Dr. Rodney Langtonhomas [facility]kand[facility] ([facility] if not improving about two weeks.     Lattie HawStephen A  Kymir Coles, MD 01/12/14 [phone]

## 2015-07-03 ENCOUNTER — Emergency Department
Admission: EM | Admit: 2015-07-03 | Discharge: 2015-07-03 | Disposition: A | Discharge status: Home / Self Care | Payer: BLUE CROSS/BLUE SHIELD | Source: Home / Self Care | Attending: Family Medicine | Admitting: Family Medicine

## 2015-07-03 ENCOUNTER — Encounter: Payer: Self-pay | Admitting: *Deleted

## 2015-07-03 DIAGNOSIS — L259 Unspecified contact dermatitis, unspecified cause: Secondary | ICD-10-CM | POA: Diagnosis not present

## 2015-07-03 DIAGNOSIS — L298 Other pruritus: Secondary | ICD-10-CM

## 2015-07-03 MED ORDER — TRIAMCINOLONE ACETONIDE 0.1 % EX CREA: 1.0000 "application " | TOPICAL_CREAM | Freq: Two times a day (BID) | CUTANEOUS | Status: DC

## 2015-07-03 MED ORDER — HYDROXYZINE HCL 25 MG PO TABS: 25.0000 mg | ORAL_TABLET | Freq: Four times a day (QID) | ORAL | Status: DC | PRN

## 2015-07-03 MED ORDER — PREDNISONE 20 MG PO TABS: ORAL_TABLET | ORAL | Status: DC

## 2015-07-03 NOTE — Discharge Instructions (Signed)
You have been prescribed 5 days of prednisone, an oral steroid to help with inflammation and itching.  You may start this medication tomorrow with breakfast as it can make it difficult to sleep if taken at night.  Atarax (hydroxizine) is an antihistamine that can be taken to help with itching. This medication can cause drowsiness so do not drive or drink alcohol while taking.   You may also try over the counter non-drowsy antihistamine such as Claritin or Zyrtec (generic form is okay too) to take during the day as needed for itching.   Contact Dermatitis Dermatitis is redness, soreness, and swelling (inflammation) of the skin. Contact dermatitis is a reaction to certain substances that touch the skin. You either touched something that irritated your skin, or you have allergies to something you touched.  HOME CARE  Skin Care  Moisturize your skin as needed.  Apply cool compresses to the affected areas.   Try taking a bath with:   Epsom salts. Follow the instructions on the package. You can get these at a pharmacy or grocery store.   Baking soda. Pour a small amount into the bath as told by your doctor.   Colloidal oatmeal. Follow the instructions on the package. You can get this at a pharmacy or grocery store.   Try applying baking soda paste to your skin. Stir water into baking soda until it looks like paste.  Do not scratch your skin.   Bathe less often.  Bathe in lukewarm water. Avoid using hot water.  Medicines  Take or apply over-the-counter and prescription medicines only as told by your doctor.   If you were prescribed an antibiotic medicine, take or apply your antibiotic as told by your doctor. Do not stop taking the antibiotic even if your condition starts to get better. General Instructions  Keep all follow-up visits as told by your doctor. This is important.   Avoid the substance that caused your reaction. If you do not know what caused it, keep a journal to  try to track what caused it. Write down:   What you eat.   What cosmetic products you use.   What you drink.   What you wear in the affected area. This includes jewelry.   If you were given a bandage (dressing), take care of it as told by your doctor. This includes when to change and remove it.  GET HELP IF:   You do not get better with treatment.   Your condition gets worse.   You have signs of infection such as:  Swelling.  Tenderness.  Redness.  Soreness.  Warmth.   You have a fever.   You have new symptoms.  GET HELP RIGHT AWAY IF:   You have a very bad headache.  You have neck pain.  Your neck is stiff.   You throw up (vomit).   You feel very sleepy.   You see red streaks coming from the affected area.   Your bone or joint underneath the affected area becomes painful after the skin has healed.   The affected area turns darker.   You have trouble breathing.    This information is not intended to replace advice given to you by your health care provider. Make sure you discuss any questions you have with your health care provider.   Document Released: 10/21/2008 Document Revised: 09/14/2014 Document Reviewed: 05/11/2014 Elsevier Interactive Patient Education [employer]2016 [employer]

## 2015-07-03 NOTE — ED Provider Notes (Signed)
CSN: [id number]     Arrival date & time 07/03/15  1255 History   First MD Initiated Contact with Patient 07/03/15 1325     Chief Complaint  Patient presents with  . Rash   (Consider location/radiation/quality/duration/timing/severity/associated sxs/prior Treatment) HPI  Eric Esparza is a 30 y.o. male presenting to UC with c/o diffuse erythematous pruritic rash to arms, legs, abdomen, and back that started about 1 week ago, gradually worsening. Mild to moderately pruritic w/o relief from OTC hydrocortisone cream.  His wife has a similar rash. They were both doing yard work the day before the rash started. Denies fever, chills, n/v/d. Denies new soaps, lotions or medications. No known allergies.    History reviewed. No pertinent past medical history. Past Surgical History  Procedure Laterality Date  . Foot surgery      cyst removed on right foot  . Wisdom tooth extraction     History reviewed. No pertinent family history. Social History  Substance Use Topics  . Smoking status: Never Smoker   . Smokeless tobacco: None  . Alcohol Use: Yes     Comment: 1    Review of Systems  Constitutional: Negative for fever and chills.  Respiratory: Negative for shortness of breath, wheezing and stridor.   Gastrointestinal: Negative for nausea, vomiting and diarrhea.  Skin: Positive for rash. Negative for wound.    Allergies  Review of patient's allergies indicates no known allergies.  Home Medications   Prior to Admission medications   Medication Sig Start Date End Date Taking? Authorizing Provider  hydrOXYzine (ATARAX/VISTARIL) 25 MG tablet Take 1 tablet (25 mg total) by mouth every 6 (six) hours as needed for itching. 07/03/15   Junius FinnerErin O'Malley, PA-C  predniSONE (DELTASONE) 20 MG tablet 3 tabs po day one, then 2 po daily x 4 days 07/03/15   Junius FinnerErin O'Malley, PA-C  triamcinolone cream (KENALOG) 0.1 % Apply 1 application topically 2 (two) times daily. 07/03/15   Junius FinnerErin O'Malley, PA-C   Meds Ordered  and Administered this Visit  Medications - No data to display  BP 109/76 mmHg  Pulse 50  Temp(Src) 97.6 F (36.4 C)  Wt 156 lb (70.761 kg)  SpO2 99% No data found.   Physical Exam  Constitutional: He is oriented to person, place, and time. He appears well-developed and well-nourished.  HENT:  Head: Normocephalic and atraumatic.  Eyes: EOM are normal.  Neck: Normal range of motion.  Cardiovascular: Normal rate.   Pulmonary/Chest: Effort normal.  Musculoskeletal: Normal range of motion.  Neurological: He is alert and oriented to person, place, and time.  Skin: Skin is warm and dry. Rash noted. There is erythema.  Erythematous maculopapular rash on arms, legs, back, and abdomen. Faint diffuse vesicles with some draining thin yellow discharge.  Non-tender.  Psychiatric: He has a normal mood and affect. His behavior is normal.  Nursing note and vitals reviewed.   ED Course  Procedures (including critical care time)  Labs Review Labs Reviewed - No data to display  Imaging Review No results found.    MDM   1. Pruritic erythematous rash   2. Contact dermatitis    Rash c/w contact dermatitis w/o evidence of underlying infection.  Rx: Prednisone, triamcinolone and atarax.  Advised he may take an OTC non-drowsy antihistamine during the day such as Zyrtec or Claritin (or generic form) f/u with PCP in 1 week if not improving, sooner if worsening. Patient verbalized understanding and agreement with treatment plan.      Junius FinnerErin O'Malley,  PA-C 07/03/15 1601

## 2015-07-03 NOTE — ED Notes (Signed)
Pt c/o possible poison ivy rash to both arms and legs x 1 week. He and his wife both have this rash, performed yard work prior to the rash. Used otc lotion without relief.

## 2015-10-15 ENCOUNTER — Encounter: Payer: Self-pay | Admitting: Emergency Medicine

## 2015-10-15 ENCOUNTER — Emergency Department
Admission: EM | Admit: 2015-10-15 | Discharge: 2015-10-15 | Disposition: A | Discharge status: Home / Self Care | Payer: BLUE CROSS/BLUE SHIELD | Source: Home / Self Care | Attending: Family Medicine | Admitting: Family Medicine

## 2015-10-15 DIAGNOSIS — R350 Frequency of micturition: Secondary | ICD-10-CM | POA: Diagnosis not present

## 2015-10-15 LAB — POCT CBC W AUTO DIFF (K'VILLE URGENT CARE)
Hematocrit: 47 % (ref 38.5–51)
Hemoglobin: 16.2 g/dL (ref 13–17)
Lymphocytes absolute: 1.7 10*3/uL (ref 0.1–1.8)
Lymphocytes relative %: 27.4 % (ref 15–45)
MCH: 29.9 pg (ref 26.5–32.5)
MCHC: 34.4 g/dL (ref 32.5–36.9)
MCV: 86.9 fL (ref 80–98)
MPV: 8.5 fL (ref 7.8–11)
Monocyes absolute: 0.5 10*3/uL (ref 0.1–1)
Monocytes relative %: 8.1 % (ref 2–10)
Neutrophils absolute (GR#): 3.9 10*3/uL (ref 1.7–7.7)
Neutrophils relative % (GR): 64.5 % (ref 44–76)
Platelet count: 146 10*3/uL (ref 140–400)
RBC: 5.4 MIL/uL (ref 4.2–5.8)
RDW: 13.6 % (ref 11.6–14)
WBC: 6.1 10*3/uL (ref 4.5–10.5)

## 2015-10-15 LAB — POCT URINALYSIS DIP (MANUAL ENTRY)
Bilirubin, UA: NEGATIVE
Blood, UA: NEGATIVE
Glucose, UA: NEGATIVE
Ketones, POC UA: NEGATIVE
Leukocytes, UA: NEGATIVE
Nitrite, UA: NEGATIVE
Protein Ur, POC: NEGATIVE
Spec Grav, UA: 1.015
Urobilinogen, UA: 0.2
pH, UA: 6

## 2015-10-15 NOTE — ED Triage Notes (Signed)
Pt c/o urgency, frequency, dysuria x 3 days.  No hematuria, uncomfortable.

## 2015-10-15 NOTE — ED Provider Notes (Signed)
CSN: [id number]     Arrival date & time 10/15/15  1333 History   First MD Initiated Contact with Patient 10/15/15 1352     Chief Complaint  Patient presents with  . Dysuria   (Consider location/radiation/quality/duration/timing/severity/associated sxs/prior Treatment) HPI  Eric Esparza is a 30 y.o. male presenting to UC with c/o urinary frequency, urgency and mild dysuria for about 3 days. Denies abdominal pain, lower back pain, or hematuria. Denies penile discharge or concern for STDs as he is in a monogamous marriage. Denies fever, chills, n/v/d. Denies hx of bladder infections or known kidney problems.    History reviewed. No pertinent past medical history. Past Surgical History:  Procedure Laterality Date  . FOOT SURGERY     cyst removed on right foot  . WISDOM TOOTH EXTRACTION     No family history on file. Social History  Substance Use Topics  . Smoking status: Never Smoker  . Smokeless tobacco: Never Used  . Alcohol use Yes     Comment: 1    Review of Systems  Constitutional: Negative for chills and fever.  Gastrointestinal: Negative for abdominal pain, diarrhea, nausea and vomiting.  Genitourinary: Positive for dysuria, frequency and urgency. Negative for decreased urine volume, discharge, flank pain, hematuria, penile pain, penile swelling, scrotal swelling and testicular pain.  Musculoskeletal: Negative for back pain and myalgias.  Skin: Negative for rash.    Allergies  Review of patient's allergies indicates no known allergies.  Home Medications   Prior to Admission medications   Not on File   Meds Ordered and Administered this Visit  Medications - No data to display  BP 122/77 (BP Location: Left Arm)   Pulse 63   Temp 98 F (36.7 C) (Oral)   Ht 5\' 11"  (1.803 m)   Wt 163 lb 8 oz (74.2 kg)   SpO2 99%   BMI 22.80 kg/m  No data found.   Physical Exam  Constitutional: He is oriented to person, place, and time. He appears well-developed and  well-nourished. No distress.  HENT:  Head: Normocephalic and atraumatic.  Mouth/Throat: Oropharynx is clear and moist.  Eyes: EOM are normal.  Neck: Normal range of motion.  Cardiovascular: Normal rate and regular rhythm.   Pulmonary/Chest: Effort normal and breath sounds normal. No respiratory distress. He has no wheezes. He has no rales.  Abdominal: Soft. Bowel sounds are normal. He exhibits no distension and no mass. There is no tenderness. There is no rebound, no guarding and no CVA tenderness. No hernia.  Musculoskeletal: Normal range of motion.  Neurological: He is alert and oriented to person, place, and time.  Skin: Skin is warm and dry. He is not diaphoretic.  Psychiatric: He has a normal mood and affect. His behavior is normal.  Nursing note and vitals reviewed.   Urgent Care Course   Clinical Course    Procedures (including critical care time)  Labs Review Labs Reviewed  POCT URINALYSIS DIP (MANUAL ENTRY) - Abnormal; Notable for the following:       Result Value   Color, UA light yellow (*)    All other components within normal limits  COMPLETE METABOLIC PANEL WITH GFR  POCT CBC W AUTO DIFF ([facility])    Imaging Review No results found.   MDM   1. Urinary frequency    Pt c/o urinary symptoms for about 3 days w/o concern for STDs. No prior hx of bladder infections or kidney problems.   UA: normal CBC: normal, no leukocytosis  CMP: pending  Encouraged f/u with PCP or urology later this week if not improving, especially if labs come back normal and still having symptoms.  Resource guide provided.   Junius Finner, PA-C 10/15/15 1418

## 2015-10-16 LAB — COMPLETE METABOLIC PANEL WITH GFR
ALT: 11 U/L (ref 9–46)
AST: 16 U/L (ref 10–40)
Albumin: 4.8 g/dL (ref 3.6–5.1)
Alkaline Phosphatase: 60 U/L (ref 40–115)
BUN: 14 mg/dL (ref 7–25)
CO2: 31 mmol/L (ref 20–31)
Calcium: 10 mg/dL (ref 8.6–10.3)
Chloride: 103 mmol/L (ref 98–110)
Creat: 0.87 mg/dL (ref 0.60–1.35)
GFR, Est African American: >89 mL/min (ref >=60)
GFR, Est Non African American: >89 mL/min (ref >=60)
Glucose, Bld: 81 mg/dL (ref 65–99)
Potassium: 4.6 mmol/L (ref 3.5–5.3)
Sodium: 141 mmol/L (ref 135–146)
Total Bilirubin: 0.9 mg/dL (ref 0.2–1.2)
Total Protein: 7 g/dL (ref 6.1–8.1)

## 2015-10-16 LAB — URINE CULTURE: Organism ID, Bacteria: NO GROWTH

## 2015-10-17 ENCOUNTER — Telehealth: Payer: Self-pay | Admitting: *Deleted

## 2015-10-17 NOTE — Telephone Encounter (Signed)
Spoke to pt given lab results. He reports that he is minimally improved. Encouraged him to push fluids and schedule a urologist appt for f/u. Pt agrees. Clemens Catholichristy Khrista Braun, LPN

## 2016-03-30 ENCOUNTER — Encounter: Payer: Self-pay | Admitting: *Deleted

## 2016-03-30 ENCOUNTER — Emergency Department
Admission: EM | Admit: 2016-03-30 | Discharge: 2016-03-30 | Disposition: A | Discharge status: Home / Self Care | Payer: BLUE CROSS/BLUE SHIELD | Source: Home / Self Care | Attending: Family Medicine | Admitting: Family Medicine

## 2016-03-30 DIAGNOSIS — R109 Unspecified abdominal pain: Secondary | ICD-10-CM

## 2016-03-30 DIAGNOSIS — R350 Frequency of micturition: Secondary | ICD-10-CM

## 2016-03-30 DIAGNOSIS — R3129 Other microscopic hematuria: Secondary | ICD-10-CM

## 2016-03-30 LAB — POCT URINALYSIS DIP (MANUAL ENTRY)
BILIRUBIN UA: NEGATIVE
Bilirubin, UA: NEGATIVE
GLUCOSE UA: NEGATIVE
Leukocytes, UA: NEGATIVE
Nitrite, UA: NEGATIVE
PROTEIN UA: NEGATIVE
Spec Grav, UA: 1.025 (ref 1.030–1.035)
UROBILINOGEN UA: 0.2 (ref ?–2.0)
pH, UA: 6 (ref 5.0–8.0)

## 2016-03-30 MED ORDER — TAMSULOSIN HCL 0.4 MG PO CAPS: 0.4000 mg | ORAL_CAPSULE | Freq: Every day | ORAL | 1 refills | Status: AC

## 2016-03-30 NOTE — Discharge Instructions (Signed)
Increase fluid intake.  Strain urine in order to collect kidney stone for analysis.  Take Ibuprofen 200mg , 4 tabs every 8 hours with food.  If symptoms become significantly worse during the night or over the weekend, proceed to the local emergency room.

## 2016-03-30 NOTE — ED Provider Notes (Signed)
[facility]    CSN: [id number] Arrival date & time: 03/30/16  1718     History   Chief Complaint Chief Complaint  Patient presents with  . Polyuria  . Flank Pain    HPI Eric Esparza is a 31 y.o. male.   Patient developed urinary frequency without dysuria two days ago.  Yesterday he had some vague low back and flank pain that has improved.  No urethral discharge.  No abdominal pain.  No fevers, chills, and sweats.  He feels well otherwise. In October 2017 he had urinary frequency for about two days.  He believed that he may have passed a small stone and the symptoms resolved. He has a family history of kidney stones (father).   The history is provided by the patient.  Flank Pain  This is a recurrent problem. The current episode started 2 days ago. The problem occurs constantly. The problem has not changed since onset.Pertinent negatives include no abdominal pain. Nothing aggravates the symptoms. Nothing relieves the symptoms. He has tried nothing for the symptoms.    History reviewed. No pertinent past medical history.  Patient Active Problem List   Diagnosis Date Noted  . CHILLS WITHOUT FEVER 01/04/2009  . CLOSED FRACTURE OF PATELLA 01/04/2009  . CONTUSION OF KNEE 01/04/2009    Past Surgical History:  Procedure Laterality Date  . FOOT SURGERY     cyst removed on right foot  . WISDOM TOOTH EXTRACTION         Home Medications    Prior to Admission medications   Medication Sig Start Date End Date Taking? Authorizing Provider  tamsulosin (FLOMAX) 0.4 MG CAPS capsule Take 1 capsule (0.4 mg total) by mouth daily after supper. 03/30/16   Lattie HawStephen A Beese, MD    Family History History reviewed. No pertinent family history.  Social History Social History  Substance Use Topics  . Smoking status: Never Smoker  . Smokeless tobacco: Never Used  . Alcohol use Yes     Comment: 1     Allergies   Patient has no known allergies.   Review of  Systems Review of Systems  Gastrointestinal: Negative for abdominal pain.  Genitourinary: Positive for flank pain.  All other systems reviewed and are negative.    Physical Exam Triage Vital Signs ED Triage Vitals  Enc Vitals Group     BP 03/30/16 1746 105/70     Pulse Rate 03/30/16 1746 65     Resp --      Temp 03/30/16 1746 98.1 F (36.7 C)     Temp Source 03/30/16 1746 Oral     SpO2 03/30/16 1746 97 %     Weight 03/30/16 1747 170 lb (77.1 kg)     Height --      Head Circumference --      Peak Flow --      Pain Score 03/30/16 1747 0     Pain Loc --      Pain Edu? --      Excl. in GC? --    No data found.   Updated Vital Signs BP 105/70 (BP Location: Left Arm)   Pulse 65   Temp 98.1 F (36.7 C) (Oral)   Wt 170 lb (77.1 kg)   SpO2 97%   BMI 23.71 kg/m   Visual Acuity Right Eye Distance:   Left Eye Distance:   Bilateral Distance:    Right Eye Near:   Left Eye Near:    Bilateral Near:  Physical Exam Nursing notes and Vital Signs reviewed. Appearance:  Patient appears stated age, and in no acute distress Eyes:  Pupils are equal, round, and reactive to light and accomodation.  Extraocular movement is intact.  Conjunctivae are not inflamed  Ears:  Canals normal.  Tympanic membranes normal.  Nose:   Normal turbinates.  No sinus tenderness.    Pharynx:  Normal Neck:  Supple.   No adenopathy. Lungs:  Clear to auscultation.  Breath sounds are equal.  Moving air well. Heart:  Regular rate and rhythm without murmurs, rubs, or gallops.  Abdomen:  Nontender without masses or hepatosplenomegaly.  Bowel sounds are present.  No CVA or flank tenderness.  Extremities:  No edema.  Skin:  No rash present.    UC Treatments / Results  Labs (all labs ordered are listed, but only abnormal results are displayed) Labs Reviewed  POCT URINALYSIS DIP (MANUAL ENTRY) - Abnormal; Notable for the following:       Result Value   Blood, UA large (*)    All other components  within normal limits  URINE CULTURE    EKG  EKG Interpretation None       Radiology No results found.  Procedures Procedures (including critical care time)  Medications Ordered in UC Medications - No data to display   Initial Impression / Assessment and Plan / UC Course  I have reviewed the triage vital signs and the nursing notes.  Pertinent labs & imaging results that were available during my care of the patient were reviewed by me and considered in my medical decision making (see chart for details).    Suspect nephrolithiasis. Begin Flomax.  Urine culture pending. Dispensed urine strainer. Increase fluid intake.  Strain urine in order to collect kidney stone for analysis.  Take Ibuprofen 200mg , 4 tabs every 8 hours with food.  If symptoms become significantly worse during the night or over the weekend, proceed to the local emergency room.  Followup with urologist as soon as possible.     Final Clinical Impressions(s) / UC Diagnoses   Final diagnoses:  Urinary frequency  Microscopic hematuria  Right flank pain    New Prescriptions New Prescriptions   TAMSULOSIN (FLOMAX) 0.4 MG CAPS CAPSULE    Take 1 capsule (0.4 mg total) by mouth daily after supper.     Lattie Haw, MD 03/31/16 2141

## 2016-03-30 NOTE — ED Triage Notes (Signed)
Patient c/o flank pain and urinary frequency x yesterday

## 2016-03-31 LAB — URINE CULTURE: ORGANISM ID, BACTERIA: NO GROWTH

## 2016-04-01 ENCOUNTER — Telehealth: Payer: Self-pay | Admitting: *Deleted

## 2016-04-01 NOTE — Telephone Encounter (Signed)
Callback: Patient UCX given. He is still having pain. Apt with urology in 2 days.

## 2016-04-02 ENCOUNTER — Encounter (HOSPITAL_COMMUNITY): Payer: Self-pay

## 2016-04-02 ENCOUNTER — Emergency Department (HOSPITAL_COMMUNITY): Payer: BLUE CROSS/BLUE SHIELD

## 2016-04-02 ENCOUNTER — Emergency Department (HOSPITAL_COMMUNITY)
Admission: EM | Admit: 2016-04-02 | Discharge: 2016-04-02 | Disposition: A | Discharge status: Home / Self Care | Payer: BLUE CROSS/BLUE SHIELD | Attending: Emergency Medicine | Admitting: Emergency Medicine

## 2016-04-02 DIAGNOSIS — R109 Unspecified abdominal pain: Secondary | ICD-10-CM | POA: Diagnosis present

## 2016-04-02 DIAGNOSIS — N200 Calculus of kidney: Secondary | ICD-10-CM | POA: Insufficient documentation

## 2016-04-02 DIAGNOSIS — N210 Calculus in bladder: Secondary | ICD-10-CM | POA: Diagnosis not present

## 2016-04-02 DIAGNOSIS — Z79899 Other long term (current) drug therapy: Secondary | ICD-10-CM | POA: Insufficient documentation

## 2016-04-02 DIAGNOSIS — Z7982 Long term (current) use of aspirin: Secondary | ICD-10-CM | POA: Insufficient documentation

## 2016-04-02 LAB — COMPREHENSIVE METABOLIC PANEL
ALT: 17 U/L (ref 17–63)
AST: 23 U/L (ref 15–41)
Albumin: 4.8 g/dL (ref 3.5–5.0)
Alkaline Phosphatase: 69 U/L (ref 38–126)
Anion gap: 11 (ref 5–15)
BILIRUBIN TOTAL: 0.9 mg/dL (ref 0.3–1.2)
BUN: 19 mg/dL (ref 6–20)
CALCIUM: 9.8 mg/dL (ref 8.9–10.3)
CO2: 24 mmol/L (ref 22–32)
CREATININE: 1.09 mg/dL (ref 0.61–1.24)
Chloride: 101 mmol/L (ref 101–111)
Glucose, Bld: 120 mg/dL — ABNORMAL HIGH (ref 65–99)
Potassium: 4.1 mmol/L (ref 3.5–5.1)
Sodium: 136 mmol/L (ref 135–145)
TOTAL PROTEIN: 7.2 g/dL (ref 6.5–8.1)

## 2016-04-02 LAB — CBC WITH DIFFERENTIAL/PLATELET
Basophils Absolute: 0 10*3/uL (ref 0.0–0.1)
Basophils Relative: 0 %
EOS PCT: 0 %
Eosinophils Absolute: 0 10*3/uL (ref 0.0–0.7)
HEMATOCRIT: 43 % (ref 39.0–52.0)
Hemoglobin: 14.3 g/dL (ref 13.0–17.0)
LYMPHS ABS: 0.7 10*3/uL (ref 0.7–4.0)
LYMPHS PCT: 5 %
MCH: 29.2 pg (ref 26.0–34.0)
MCHC: 33.3 g/dL (ref 30.0–36.0)
MCV: 87.8 fL (ref 78.0–100.0)
MONO ABS: 0.5 10*3/uL (ref 0.1–1.0)
Monocytes Relative: 4 %
NEUTROS ABS: 11.1 10*3/uL — AB (ref 1.7–7.7)
Neutrophils Relative %: 91 %
Platelets: 123 10*3/uL — ABNORMAL LOW (ref 150–400)
RBC: 4.9 MIL/uL (ref 4.22–5.81)
RDW: 13.2 % (ref 11.5–15.5)
WBC: 12.2 10*3/uL — AB (ref 4.0–10.5)

## 2016-04-02 LAB — URINALYSIS, ROUTINE W REFLEX MICROSCOPIC
BILIRUBIN URINE: NEGATIVE
Glucose, UA: NEGATIVE mg/dL
KETONES UR: 15 mg/dL — AB
NITRITE: NEGATIVE
PROTEIN: NEGATIVE mg/dL
Specific Gravity, Urine: 1.01 (ref 1.005–1.030)
pH: 6 (ref 5.0–8.0)

## 2016-04-02 LAB — URINALYSIS, MICROSCOPIC (REFLEX): Bacteria, UA: NONE SEEN

## 2016-04-02 MED ORDER — SODIUM CHLORIDE 0.9 % IV BOLUS (SEPSIS)
1000.0000 mL | Freq: Once | INTRAVENOUS | Status: AC
  Administered 2016-04-02: 1000 mL via INTRAVENOUS

## 2016-04-02 MED ORDER — KETOROLAC TROMETHAMINE 15 MG/ML IJ SOLN
15.0000 mg | Freq: Once | INTRAMUSCULAR | Status: AC
  Administered 2016-04-02: 15 mg via INTRAVENOUS
  Filled 2016-04-02: qty 1

## 2016-04-02 NOTE — ED Triage Notes (Signed)
Pt states right side flank pain that began on Saturday. [facility]. Had appt with urology tomorrow but pain has worsened. Pt also reports n/v.

## 2016-04-02 NOTE — ED Notes (Signed)
Pt did not need anything at this time  

## 2016-04-02 NOTE — ED Notes (Signed)
Patient transported to CT 

## 2016-04-02 NOTE — ED Notes (Addendum)
Pt wife is in room. She is knows that we nee a UA sample

## 2016-04-02 NOTE — ED Provider Notes (Signed)
[facility] Provider Note   CSN: [id number] Arrival date & time: 04/02/16  0802     History   Chief Complaint Chief Complaint  Patient presents with  . Flank Pain    HPI Eric Esparza is a 31 y.o. male.  HPI   Lower right flank pain on Saturday and urinary frequency (started Thursday).  Mild intermittent 3-4/10. Went to urgent care. Given flomax/ibuprofen and increased fluids for probable kidney stone. Continued to have intermittent symptoms until 5AM this morning when developed back pain radiating to the right groin.  8/10 constant.  Hx of frequency with ?passage of kidney stone in the past but never had formal diagnosis.  Dad has hx of nephrolithiasis. Had n/v   History reviewed. No pertinent past medical history.  Patient Active Problem List   Diagnosis Date Noted  . CHILLS WITHOUT FEVER 01/04/2009  . CLOSED FRACTURE OF PATELLA 01/04/2009  . CONTUSION OF KNEE 01/04/2009    Past Surgical History:  Procedure Laterality Date  . FOOT SURGERY     cyst removed on right foot  . WISDOM TOOTH EXTRACTION         Home Medications    Prior to Admission medications   Medication Sig Start Date End Date Taking? Authorizing Provider  aspirin-acetaminophen-caffeine (EXCEDRIN MIGRAINE) [phone] MG tablet Take 1 tablet by mouth every 6 (six) hours as needed for headache.   Yes Historical Provider, MD  ibuprofen (ADVIL,MOTRIN) 200 MG tablet Take 200 mg by mouth every 6 (six) hours as needed for moderate pain.   Yes Historical Provider, MD  tamsulosin (FLOMAX) 0.4 MG CAPS capsule Take 1 capsule (0.4 mg total) by mouth daily after supper. 03/30/16  Yes Lattie Haw, MD    Family History History reviewed. No pertinent family history.  Social History Social History  Substance Use Topics  . Smoking status: Never Smoker  . Smokeless tobacco: Never Used  . Alcohol use Yes     Comment: 1     Allergies   Patient has no known allergies.   Review of Systems Review of  Systems  Constitutional: Negative for fever.  HENT: Negative for sore throat.   Eyes: Negative for visual disturbance.  Respiratory: Negative for shortness of breath.   Cardiovascular: Negative for chest pain.  Gastrointestinal: Positive for abdominal pain, nausea and vomiting. Negative for constipation and diarrhea.  Genitourinary: Positive for flank pain. Negative for difficulty urinating and dysuria.  Musculoskeletal: Negative for back pain and neck stiffness.  Skin: Negative for rash.  Neurological: Negative for syncope and headaches.     Physical Exam Updated Vital Signs BP 108/65   Pulse (!) 59   Temp 97.3 F (36.3 C) (Oral)   Resp 16   SpO2 98%   Physical Exam  Constitutional: He is oriented to person, place, and time. He appears well-developed and well-nourished. No distress.  HENT:  Head: Normocephalic and atraumatic.  Eyes: Conjunctivae and EOM are normal.  Neck: Normal range of motion.  Cardiovascular: Normal rate, regular rhythm, normal heart sounds and intact distal pulses.  Exam reveals no gallop and no friction rub.   No murmur heard. Pulmonary/Chest: Effort normal and breath sounds normal. No respiratory distress. He has no wheezes. He has no rales.  Abdominal: Soft. He exhibits no distension. There is no tenderness. There is no guarding.  Musculoskeletal: He exhibits no edema.  Neurological: He is alert and oriented to person, place, and time.  Skin: Skin is warm and dry. He is not diaphoretic.  Nursing note and vitals reviewed.    ED Treatments / Results  Labs (all labs ordered are listed, but only abnormal results are displayed) Labs Reviewed  CBC WITH DIFFERENTIAL/PLATELET - Abnormal; Notable for the following:       Result Value   WBC 12.2 (*)    Platelets 123 (*)    Neutro Abs 11.1 (*)    All other components within normal limits  COMPREHENSIVE METABOLIC PANEL - Abnormal; Notable for the following:    Glucose, Bld 120 (*)    All other  components within normal limits  URINALYSIS, ROUTINE W REFLEX MICROSCOPIC - Abnormal; Notable for the following:    Hgb urine dipstick LARGE (*)    Ketones, ur 15 (*)    Leukocytes, UA TRACE (*)    All other components within normal limits  URINALYSIS, MICROSCOPIC (REFLEX) - Abnormal; Notable for the following:    Squamous Epithelial / LPF 0-5 (*)    All other components within normal limits    EKG  EKG Interpretation None       Radiology Ct Renal Stone Study  Result Date: 04/02/2016 CLINICAL DATA:  Right flank pain EXAM: CT ABDOMEN AND PELVIS WITHOUT CONTRAST TECHNIQUE: Multidetector CT imaging of the abdomen and pelvis was performed following the standard protocol without IV contrast. COMPARISON:  None. FINDINGS: Lower chest: Lung bases are essentially clear. Hepatobiliary: Unenhanced liver is unremarkable. Gallbladder is unremarkable. No intrahepatic or extrahepatic ductal dilatation. Pancreas: Within normal limits. Spleen: Within normal limits. Adrenals/Urinary Tract: Adrenal glands are within normal limits. Kidneys are within normal limits. Mild fullness of the right renal collecting system without frank hydronephrosis. No renal for ureteral calculi. Bladder is notable for a layering 3 mm calculus (series 3/ image 80). Stomach/Bowel: Stomach is within normal limits. No evidence of bowel obstruction. Normal appendix (series 3/ image 49). Vascular/Lymphatic: No evidence of abdominal aortic aneurysm. No suspicious abdominopelvic lymphadenopathy. Reproductive: Prostate is notable for dystrophic calcifications. Other: No abdominopelvic ascites. Tiny fat containing left inguinal hernia (series 3/ image 85). Musculoskeletal: Visualized osseous structures are within normal limits. IMPRESSION: 3 mm layering bladder calculus. Mild fullness of the right renal collecting system without frank hydronephrosis. Recent passage of a right renal calculus is suspected. No evidence of bowel obstruction.  Normal  appendix. Electronically Signed   By: Charline BillsSriyesh  Krishnan M.D.   On: 04/02/2016 09:50    Procedures Procedures (including critical care time)  Medications Ordered in ED Medications  sodium chloride 0.9 % bolus 1,000 mL (0 mLs Intravenous Stopped 04/02/16 1040)  ketorolac (TORADOL) 15 MG/ML injection 15 mg (15 mg Intravenous Given 04/02/16 0929)     Initial Impression / Assessment and Plan / ED Course  I have reviewed the triage vital signs and the nursing notes.  Pertinent labs & imaging results that were available during my care of the patient were reviewed by me and considered in my medical decision making (see chart for details).     31yo male presents with concern for right flank pain. No hx of diagnosed kidney stones. No sign of infection/normal Cr.Ordered CT stone study. Pain improved just prior to CT. CT shows mild right hydro, stone in bladder, consistent with recently passed stone.  Pt urinate in ED with clearance of stone.  Discussed general kidney stone prevention.  Patient discharged in stable condition with understanding of reasons to return.    Final Clinical Impressions(s) / ED Diagnoses   Final diagnoses:  Nephrolithiasis, right sided    New Prescriptions Discharge Medication  List as of 04/02/2016 11:51 AM       Alvira Monday, MD 04/03/16 [phone]

## 2017-07-14 ENCOUNTER — Other Ambulatory Visit: Payer: Self-pay

## 2017-07-14 ENCOUNTER — Emergency Department
Admission: EM | Admit: 2017-07-14 | Discharge: 2017-07-14 | Disposition: A | Discharge status: Home / Self Care | Payer: BLUE CROSS/BLUE SHIELD | Source: Home / Self Care | Attending: Family Medicine | Admitting: Family Medicine

## 2017-07-14 DIAGNOSIS — L298 Other pruritus: Secondary | ICD-10-CM

## 2017-07-14 MED ORDER — PREDNISONE 20 MG PO TABS: ORAL_TABLET | ORAL | 0 refills | Status: AC

## 2017-07-14 MED ORDER — TRIAMCINOLONE ACETONIDE 0.1 % EX CREA
1.0000 | TOPICAL_CREAM | Freq: Two times a day (BID) | CUTANEOUS | 0 refills | Status: AC
Start: 2017-07-14 — End: ?

## 2017-07-14 MED ORDER — DEXAMETHASONE SODIUM PHOSPHATE 10 MG/ML IJ SOLN
10.0000 mg | Freq: Once | INTRAMUSCULAR | Status: AC
  Administered 2017-07-14: 10 mg via INTRAMUSCULAR

## 2017-07-14 NOTE — Discharge Instructions (Signed)
°  You were given a shot of decadron (a steroid) today to help with itching and swelling from a likely allergic reaction.  You have been prescribed 5 days of prednisone, an oral steroid.  You may start this medication tomorrow with breakfast.    You may take an over the counter antihistamine such as Claritin, Zyrtec, or Benadryl.   Please follow up with family medicine in 1 week if not improving.

## 2017-07-14 NOTE — ED Triage Notes (Signed)
Pt has poison ivy on lower limbs.  Has been present several days to a week.

## 2017-07-14 NOTE — ED Provider Notes (Signed)
[facility]    CSN: [id number] Arrival date & time: 07/14/17  1426     History   Chief Complaint Chief Complaint  Patient presents with  . Rash    HPI Daivd CouncilJohn Kohlenberg is a 32 y.o. male.   HPI  Daivd CouncilJohn Arena is a 32 y.o. male presenting to UC with c/o 5-7 days of itchy red rash on lower legs and one spot on his Left hand that started after working in his yard. Pt concerned for poison ivy. He is requesting to be tx with steroids "before it gets worse."  He recently got a full arm sleeve tattoo and hopes to prevent the rash from getting onto his Left arm.  No medication PTA.   History reviewed. No pertinent past medical history.  Patient Active Problem List   Diagnosis Date Noted  . CHILLS WITHOUT FEVER 01/04/2009  . CLOSED FRACTURE OF PATELLA 01/04/2009  . CONTUSION OF KNEE 01/04/2009    Past Surgical History:  Procedure Laterality Date  . FOOT SURGERY     cyst removed on right foot  . WISDOM TOOTH EXTRACTION         Home Medications    Prior to Admission medications   Medication Sig Start Date End Date Taking? Authorizing Provider  aspirin-acetaminophen-caffeine (EXCEDRIN MIGRAINE) [phone] [id number] MG tablet Take 1 tablet by mouth every 6 (six) hours as needed for headache.    [provider]  ibuprofen (ADVIL,MOTRIN) 200 MG tablet Take 200 mg by mouth every 6 (six) hours as needed for moderate pain.    [provider]  predniSONE (DELTASONE) 20 MG tablet 3 tabs po day one, then 2 po daily x 4 days 07/14/17   Lurene ShadowPhelps, Jerline Linzy O, PA-C  tamsulosin (FLOMAX) 0.4 MG CAPS capsule Take 1 capsule (0.4 mg total) by mouth daily after supper. 03/30/16   Lattie HawBeese, Stephen A, MD  triamcinolone cream (KENALOG) 0.1 % Apply 1 application topically 2 (two) times daily. 07/14/17   Lurene ShadowPhelps, Danae Oland O, PA-C    Family History History reviewed. No pertinent family history.  Social History Social History   Tobacco Use  . Smoking status: Never Smoker  . Smokeless tobacco: Never  Used  Substance Use Topics  . Alcohol use: Yes    Comment: 1  . Drug use: No     Allergies   Patient has no known allergies.   Review of Systems Review of Systems  Musculoskeletal: Negative for joint swelling and myalgias.  Skin: Positive for rash. Negative for wound.     Physical Exam Triage Vital Signs ED Triage Vitals  Enc Vitals Group     BP 07/14/17 1500 112/69     Pulse Rate 07/14/17 1500 (!) 53     Resp --      Temp 07/14/17 1500 98.1 F (36.7 C)     Temp Source 07/14/17 1500 Oral     SpO2 07/14/17 1500 100 %     Weight 07/14/17 1501 174 lb (78.9 kg)     Height 07/14/17 1501 5\' 11"  (1.803 m)     Head Circumference --      Peak Flow --      Pain Score 07/14/17 1501 0     Pain Loc --      Pain Edu? --      Excl. in GC? --    No data found.  Updated Vital Signs BP 112/69 (BP Location: Right Arm)   Pulse (!) 53   Temp 98.1 F (36.7 C) (Oral)  Ht 5\' 11"  (1.803 m)   Wt 174 lb (78.9 kg)   SpO2 100%   BMI 24.27 kg/m   Visual Acuity Right Eye Distance:   Left Eye Distance:   Bilateral Distance:    Right Eye Near:   Left Eye Near:    Bilateral Near:     Physical Exam  Constitutional: He is oriented to person, place, and time. He appears well-developed and well-nourished. No distress.  HENT:  Head: Normocephalic and atraumatic.  Mouth/Throat: Oropharynx is clear and moist.  Eyes: EOM are normal.  Neck: Normal range of motion.  Cardiovascular: Normal rate.  Pulmonary/Chest: Effort normal. No respiratory distress.  Musculoskeletal: Normal range of motion.  Neurological: He is alert and oriented to person, place, and time.  Skin: Skin is warm and dry. He is not diaphoretic. There is erythema.  Diffuse erythematous papular rash on bilateral ankles and feet. One lesion on Left hand. Rash does blanch. Non-tender. No bleeding or discharge.   Psychiatric: He has a normal mood and affect. His behavior is normal.  Nursing note and vitals  reviewed.    UC Treatments / Results  Labs (all labs ordered are listed, but only abnormal results are displayed) Labs Reviewed - No data to display  EKG None  Radiology No results found.  Procedures Procedures (including critical care time)  Medications Ordered in UC Medications  dexamethasone (DECADRON) injection 10 mg (10 mg Intramuscular Given 07/14/17 1534)    Initial Impression / Assessment and Plan / UC Course  I have reviewed the triage vital signs and the nursing notes.  Pertinent labs & imaging results that were available during my care of the patient were reviewed by me and considered in my medical decision making (see chart for details).     Decadron given in UC. Home care instructions provided.  Final Clinical Impressions(s) / UC Diagnoses   Final diagnoses:  Pruritic erythematous rash     Discharge Instructions      You were given a shot of decadron (a steroid) today to help with itching and swelling from a likely allergic reaction.  You have been prescribed 5 days of prednisone, an oral steroid.  You may start this medication tomorrow with breakfast.    You may take an over the counter antihistamine such as Claritin, Zyrtec, or Benadryl.   Please follow up with family medicine in 1 week if not improving.     ED Prescriptions    Medication Sig Dispense Auth. Provider   predniSONE (DELTASONE) 20 MG tablet 3 tabs po day one, then 2 po daily x 4 days 11 tablet Doroteo Glassman, Afshin Chrystal O, PA-C   triamcinolone cream (KENALOG) 0.1 % Apply 1 application topically 2 (two) times daily. 30 g Lurene Shadow, PA-C     Controlled Substance Prescriptions [place] Controlled Substance Registry consulted? Not Applicable   Rolla Plate 07/14/17 1535

## 2017-07-15 IMAGING — CT CT RENAL STONE PROTOCOL
2 of 4 series · 16 of 46 positions shown, 18 images · non-contrast
Comparison: None.

CLINICAL DATA: Right flank pain

EXAM:
CT ABDOMEN AND PELVIS WITHOUT CONTRAST
TECHNIQUE: Multidetector CT imaging of the abdomen and pelvis was performed
following the standard protocol without IV contrast.

[Series 3: stone study 5.0 i30f 2 · axial · 0.76mm/px · z∈[+764,+1244]mm · 13 of 106 slices shown, 15 images]
[im 5/106  soft-tissue]
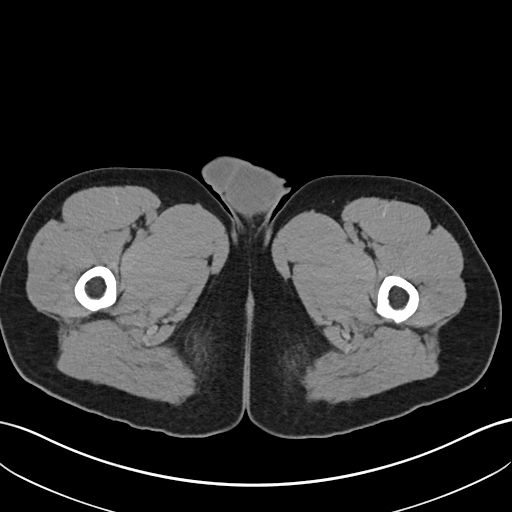
[im 5/106  bone]
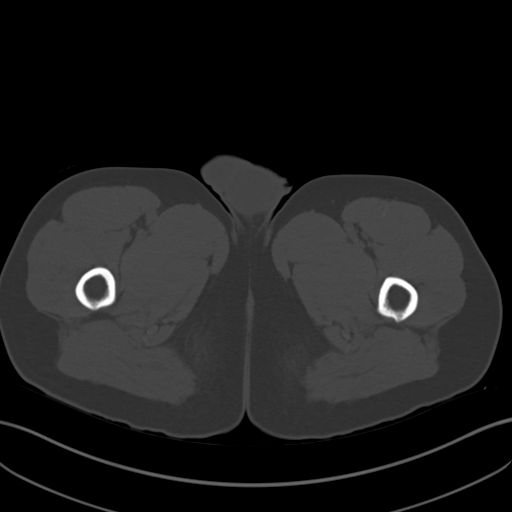
[im 13/106  soft-tissue]
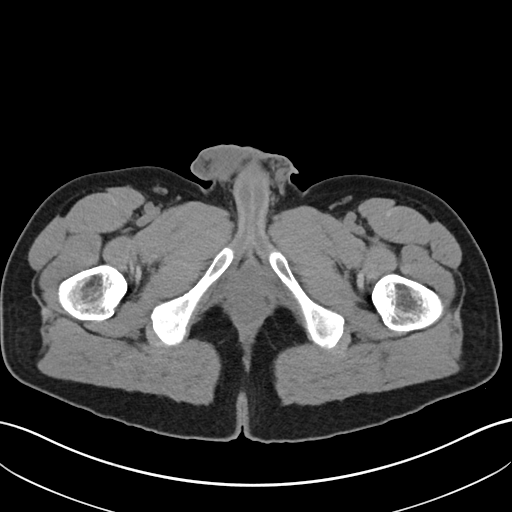
[im 22/106  soft-tissue]
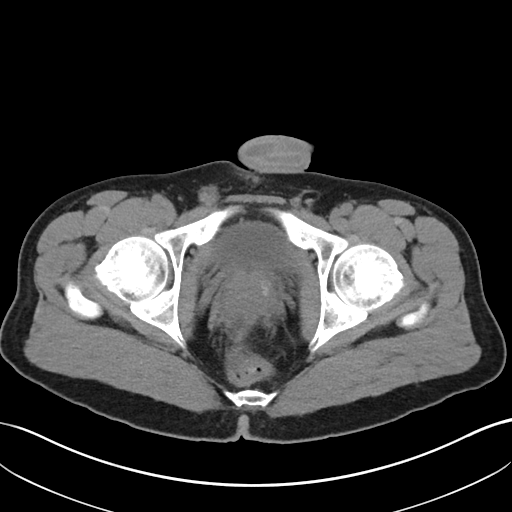
[im 30/106  soft-tissue]
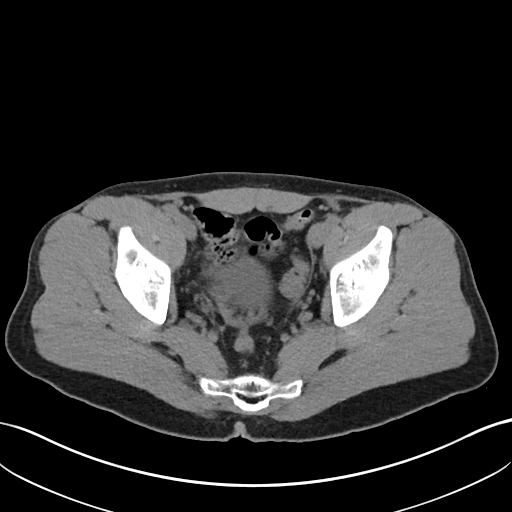
[im 38/106  soft-tissue]
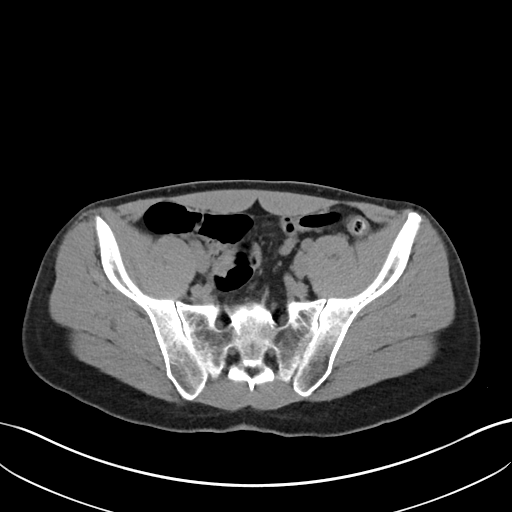
[im 47/106  soft-tissue]
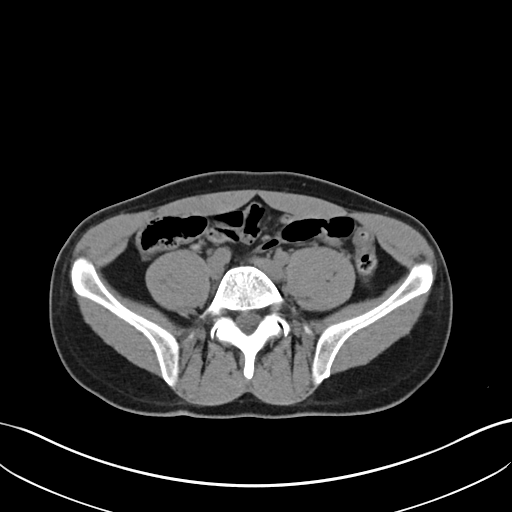
[im 55/106  soft-tissue]
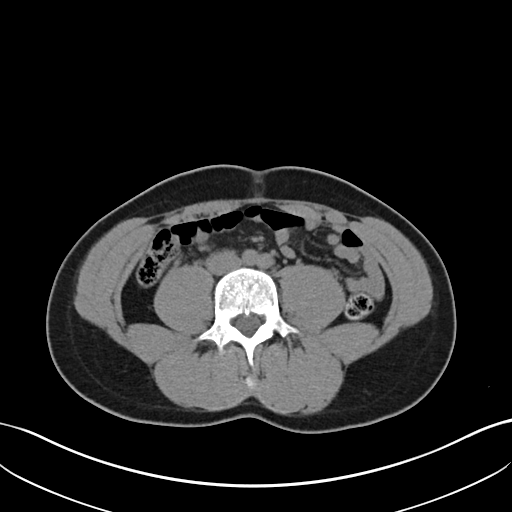
[im 59/106  soft-tissue]
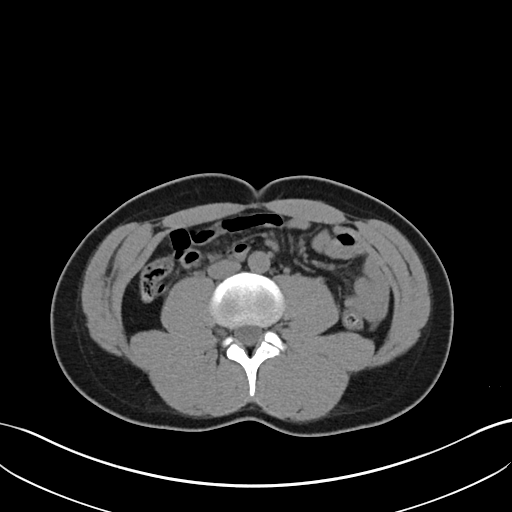
[im 68/106  soft-tissue]
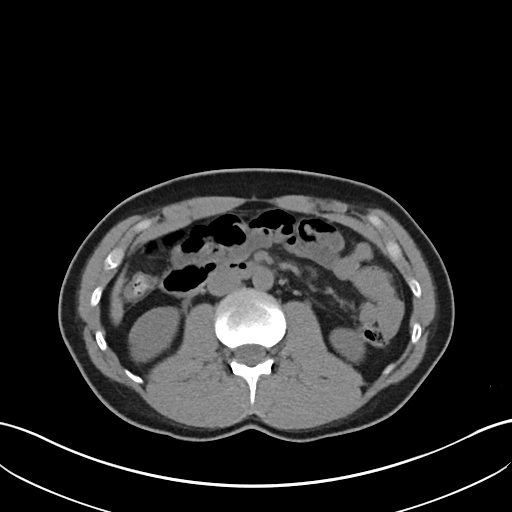
[im 68/106  bone]
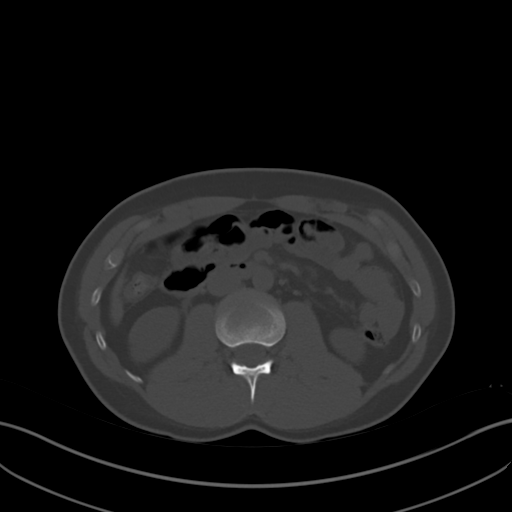
[im 76/106  soft-tissue]
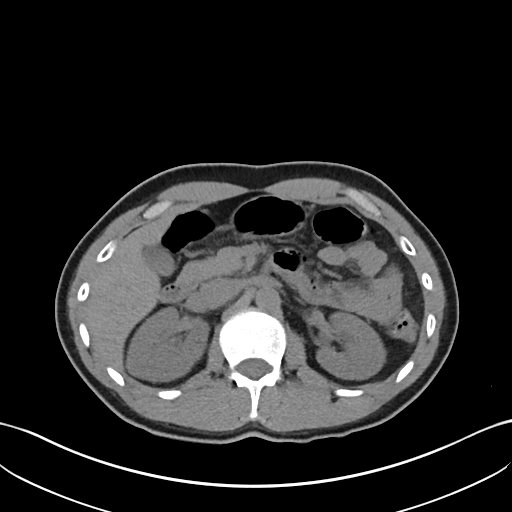
[im 85/106  soft-tissue]
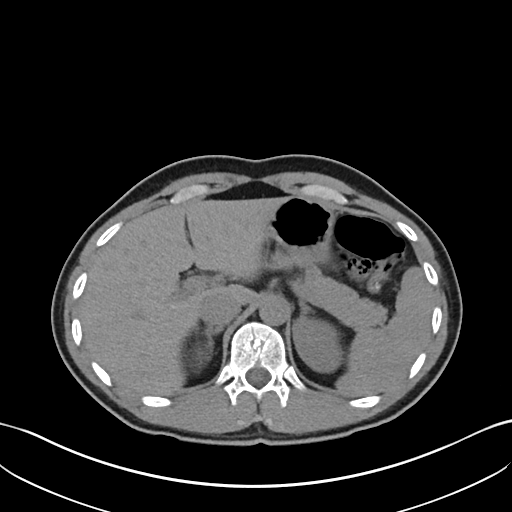
[im 93/106  soft-tissue]
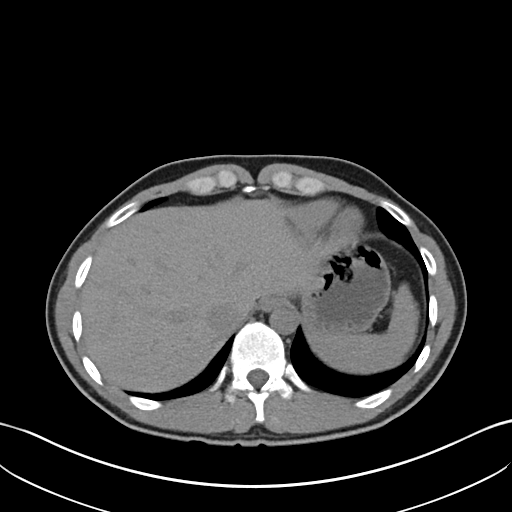
[im 101/106  soft-tissue]
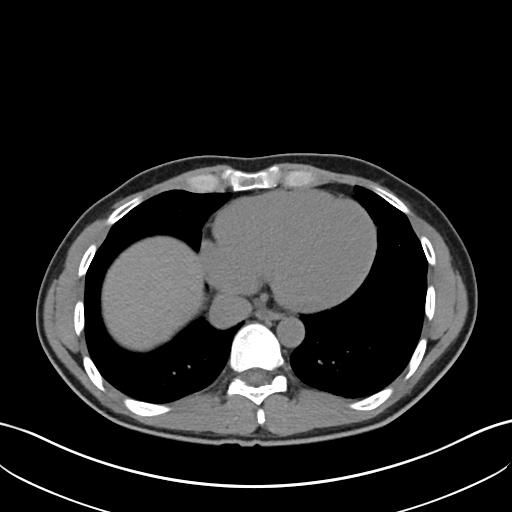

[Series 6: coronal soft tissue · coronal · 0.82mm/px · 3 of 70 slices shown]
[im 24/70  soft-tissue]
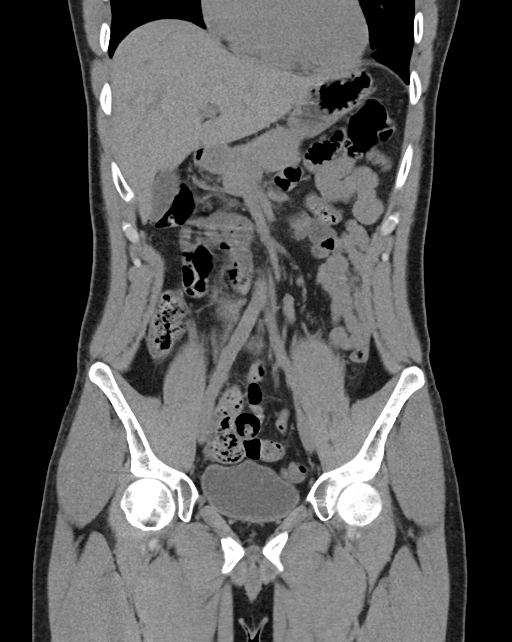
[im 31/70  soft-tissue]
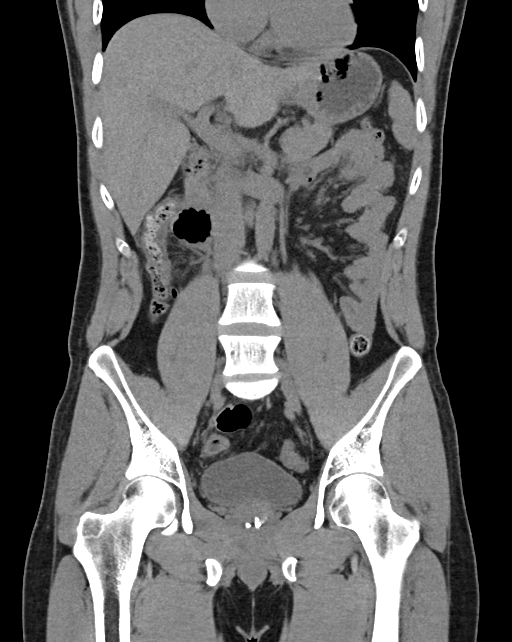
[im 39/70  soft-tissue]
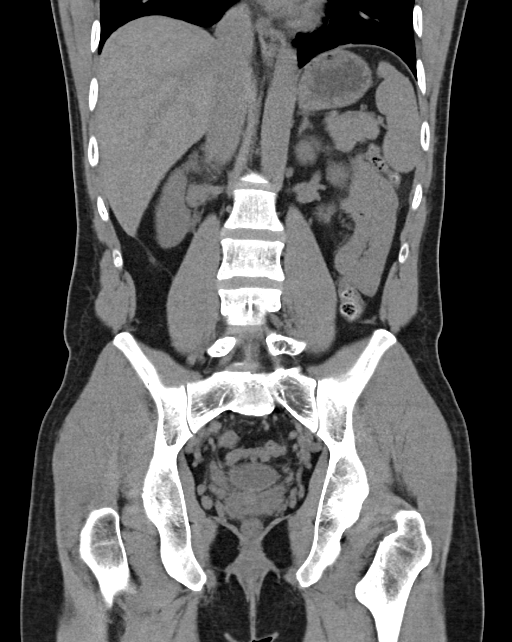

[16 of 46 positions shown; findings below may reference images not displayed]

FINDINGS: Lower chest: Lung bases are essentially clear.

Hepatobiliary: Unenhanced liver is unremarkable.

Gallbladder is unremarkable. No intrahepatic or extrahepatic ductal
dilatation.

Pancreas: Within normal limits.

Spleen: Within normal limits.

Adrenals/Urinary Tract: Adrenal glands are within normal limits.

Kidneys are within normal limits. Mild fullness of the right renal
collecting system without frank hydronephrosis.

No renal for ureteral calculi.

Bladder is notable for a layering 3 mm calculus (series 3/ image
80).

Stomach/Bowel: Stomach is within normal limits.

No evidence of bowel obstruction.

Normal appendix (series 3/ image 49).

Vascular/Lymphatic: No evidence of abdominal aortic aneurysm.

No suspicious abdominopelvic lymphadenopathy.

Reproductive: Prostate is notable for dystrophic calcifications.

Other: No abdominopelvic ascites.

Tiny fat containing left inguinal hernia (series 3/ image 85).

Musculoskeletal: Visualized osseous structures are within normal
limits.
IMPRESSION: 3 mm layering bladder calculus.

Mild fullness of the right renal collecting system without frank
hydronephrosis. Recent passage of a right renal calculus is
suspected.

No evidence of bowel obstruction.  Normal appendix.
